# Patient Record
Sex: Male | Born: 1990 | Race: Black or African American | Hispanic: No | Marital: Single | Smoking: Former smoker
Health system: Southern US, Community
[De-identification: ages and names within clinical notes are randomized; demographics above are authoritative.]

## PROBLEM LIST (undated history)

## (undated) DIAGNOSIS — R51 Headache: Secondary | ICD-10-CM

## (undated) DIAGNOSIS — J45909 Unspecified asthma, uncomplicated: Secondary | ICD-10-CM

## (undated) DIAGNOSIS — B009 Herpesviral infection, unspecified: Secondary | ICD-10-CM

## (undated) DIAGNOSIS — M1990 Unspecified osteoarthritis, unspecified site: Secondary | ICD-10-CM

## (undated) DIAGNOSIS — F32A Depression, unspecified: Secondary | ICD-10-CM

## (undated) DIAGNOSIS — G4730 Sleep apnea, unspecified: Secondary | ICD-10-CM

## (undated) DIAGNOSIS — R519 Headache, unspecified: Secondary | ICD-10-CM

## (undated) HISTORY — DX: Headache, unspecified: R51.9

## (undated) HISTORY — DX: Depression, unspecified: F32.A

## (undated) HISTORY — DX: Unspecified asthma, uncomplicated: J45.909

## (undated) HISTORY — PX: BACK SURGERY: SHX140

## (undated) HISTORY — DX: Headache: R51

## (undated) HISTORY — DX: Sleep apnea, unspecified: G47.30

## (undated) HISTORY — DX: Unspecified osteoarthritis, unspecified site: M19.90

## (undated) HISTORY — DX: Herpesviral infection, unspecified: B00.9

## (undated) HISTORY — PX: SHOULDER SURGERY: SHX246

---

## 2011-12-04 ENCOUNTER — Other Ambulatory Visit: Payer: Self-pay

## 2011-12-04 ENCOUNTER — Emergency Department (HOSPITAL_COMMUNITY): Payer: BC Managed Care – PPO

## 2011-12-04 ENCOUNTER — Emergency Department (HOSPITAL_COMMUNITY)
Admission: EM | Admit: 2011-12-04 | Discharge: 2011-12-05 | Disposition: A | Discharge status: Home Health | Payer: BC Managed Care – PPO | Attending: Emergency Medicine | Admitting: Emergency Medicine

## 2011-12-04 ENCOUNTER — Encounter (HOSPITAL_COMMUNITY): Payer: Self-pay | Admitting: Emergency Medicine

## 2011-12-04 DIAGNOSIS — F17200 Nicotine dependence, unspecified, uncomplicated: Secondary | ICD-10-CM | POA: Insufficient documentation

## 2011-12-04 DIAGNOSIS — R0789 Other chest pain: Secondary | ICD-10-CM | POA: Insufficient documentation

## 2011-12-04 DIAGNOSIS — R51 Headache: Secondary | ICD-10-CM | POA: Insufficient documentation

## 2011-12-04 LAB — BASIC METABOLIC PANEL
Chloride: 101 mEq/L (ref 96–112)
Creatinine, Ser: 0.95 mg/dL (ref 0.50–1.35)
GFR calc Af Amer: >90 mL/min (ref >90)
Potassium: 3.6 mEq/L (ref 3.5–5.1)

## 2011-12-04 LAB — POCT I-STAT, CHEM 8
BUN: 10 mg/dL (ref 6–23)
Sodium: 143 mEq/L (ref 135–145)
TCO2: 28 mmol/L (ref 0–100)

## 2011-12-04 LAB — POCT I-STAT TROPONIN I: Troponin i, poc: 0 ng/mL (ref 0.00–0.08)

## 2011-12-04 LAB — CBC
Platelets: 277 10*3/uL (ref 150–400)
RDW: 12.1 % (ref 11.5–15.5)
WBC: 11.4 10*3/uL — ABNORMAL HIGH (ref 4.0–10.5)

## 2011-12-04 MED ORDER — SODIUM CHLORIDE 0.9 % IV BOLUS (SEPSIS)
1000.0000 mL | Freq: Once | INTRAVENOUS | Status: AC
  Administered 2011-12-04: 1000 mL via INTRAVENOUS

## 2011-12-04 MED ORDER — DIPHENHYDRAMINE HCL 50 MG/ML IJ SOLN
25.0000 mg | Freq: Once | INTRAMUSCULAR | Status: AC
  Administered 2011-12-04: 25 mg via INTRAVENOUS
  Filled 2011-12-04: qty 1

## 2011-12-04 MED ORDER — KETOROLAC TROMETHAMINE 30 MG/ML IJ SOLN
30.0000 mg | Freq: Once | INTRAMUSCULAR | Status: AC
  Administered 2011-12-04: 30 mg via INTRAVENOUS
  Filled 2011-12-04: qty 1

## 2011-12-04 MED ORDER — METOCLOPRAMIDE HCL 5 MG/ML IJ SOLN
10.0000 mg | Freq: Once | INTRAMUSCULAR | Status: AC
  Administered 2011-12-04: 10 mg via INTRAVENOUS
  Filled 2011-12-04: qty 2

## 2011-12-04 NOTE — ED Notes (Signed)
Pt here from with c/o chest pain after getting in a argument with his girlfriend

## 2011-12-04 NOTE — ED Provider Notes (Signed)
History     CSN: [id number]  Arrival date & time 12/04/11  1658   First MD Initiated Contact with Patient 12/04/11 2316      Chief Complaint  Patient presents with  . Chest Pain    (Consider location/radiation/quality/duration/timing/severity/associated sxs/prior treatment) Patient is a 21 y.o. male presenting with chest pain. The history is provided by the patient.  Chest Pain   He had onset yesterday of a sharp, left-sided chest pain without radiation. Pain is been episodic. When present, it was severe 9/10, but it is completely gone now. There is no associated dyspnea, nausea, or diaphoresis. While in the emergency department, he has developed a bifrontal headache which is throbbing in nature and worse with exposure to light. He has had a slight cough productive of some yellowish sputum. He has not had any fever or chills. He denies arthralgias or myalgias. He has not taken anything for the pain. When it was present, was worse with movement and was worse with deep breath. He has never had pain might this before.  History reviewed. No pertinent past medical history.  Past Surgical History  Procedure Date  . Back surgery     History reviewed. No pertinent family history.  History  Substance Use Topics  . Smoking status: Current Everyday Smoker -- 0.5 packs/day  . Smokeless tobacco: Not on file  . Alcohol Use: Yes      Review of Systems  Cardiovascular: Positive for chest pain.  All other systems reviewed and are negative.    Allergies  Review of patient's allergies indicates no known allergies.  Home Medications   Current Outpatient Rx  Name Route Sig Dispense Refill  . AMOXICILLIN 500 MG PO CAPS Oral Take 500 mg by mouth 3 (three) times daily. For ten days starting 11/22/11      BP 157/89  Pulse 64  Temp(Src) 98.6 F (37 C) (Oral)  Resp 16  Ht 7\' 4"  (2.235 m)  Wt 245 lb (111.131 kg)  BMI 22.24 kg/m2  SpO2 100%  Physical Exam  Nursing note and vitals  reviewed.  21 year old male is resting comfortably and in no acute distress. Vital signs are significant for tachypnea with respiratory rate of 30, and borderline hypertension with blood pressure 147/80. Oxygen saturation is 100% which is normal. Head is normocephalic and atraumatic. PERRLA, EOMI. Fundi show no hemorrhage, exudate, or papilledema. Oropharynx is clear. Neck is nontender and supple without adenopathy or JVD. Lungs are clear without rales, wheezes, or rhonchi. Heart has regular rate and rhythm without murmur. There is mild left parasternal chest wall tenderness inferiorly. Abdomen is soft, flat, nontender without masses or hepatosplenomegaly. Extremities no cyanosis or edema, full range of motion is present. Skin is warm and dry without rash. Neurologic: Mental status is normal, cranial nerves are intact, there no focal motor or sensory deficits.  ED Course  Procedures (including critical care time)  Results for orders placed during the hospital encounter of 12/04/11  CBC      Component Value Range   WBC 11.4 (*) 4.0 - 10.5 (K/uL)   RBC 5.28  4.22 - 5.81 (MIL/uL)   Hemoglobin 15.1  13.0 - 17.0 (g/dL)   HCT 69.6  29.5 - 28.4 (%)   MCV 86.4  78.0 - 100.0 (fL)   MCH 28.6  26.0 - 34.0 (pg)   MCHC 33.1  30.0 - 36.0 (g/dL)   RDW 13.2  44.0 - 10.2 (%)   Platelets 277  150 - 400 (K/uL)  BASIC METABOLIC PANEL      Component Value Range   Sodium 139  135 - 145 (mEq/L)   Potassium 3.6  3.5 - 5.1 (mEq/L)   Chloride 101  96 - 112 (mEq/L)   CO2 29  19 - 32 (mEq/L)   Glucose, Bld 100 (*) 70 - 99 (mg/dL)   BUN 10  6 - 23 (mg/dL)   Creatinine, Ser 0.98  0.50 - 1.35 (mg/dL)   Calcium 11.9  8.4 - 10.5 (mg/dL)   GFR calc non Af Amer >90  >90 (mL/min)   GFR calc Af Amer >90  >90 (mL/min)  POCT I-STAT, CHEM 8      Component Value Range   Sodium 143  135 - 145 (mEq/L)   Potassium 3.7  3.5 - 5.1 (mEq/L)   Chloride 103  96 - 112 (mEq/L)   BUN 10  6 - 23 (mg/dL)   Creatinine, Ser 1.47  0.50 -  1.35 (mg/dL)   Glucose, Bld 829 (*) 70 - 99 (mg/dL)   Calcium, Ion 5.62  1.30 - 1.32 (mmol/L)   TCO2 28  0 - 100 (mmol/L)   Hemoglobin 16.3  13.0 - 17.0 (g/dL)   HCT 86.5  78.4 - 69.6 (%)  POCT I-STAT TROPONIN I      Component Value Range   Troponin i, poc 0.00  0.00 - 0.08 (ng/mL)   Comment 3            Dg Chest 2 View  12/04/2011  *RADIOLOGY REPORT*  Clinical Data: 21 year old male with left chest pain, dizziness.  CHEST - 2 VIEW  Comparison: None.  Findings: Cardiac size at the upper limits of normal. Other mediastinal contours are within normal limits.  Visualized tracheal air column is within normal limits.  Lung volumes within normal limits.  The lungs are clear.  No pneumothorax or effusion. Negative visualized bowel gas pattern. No acute osseous abnormality identified.  IMPRESSION: Cardiac size at the upper limits of normal, otherwise negative chest.  Original Report Authenticated By: Harley Hallmark, M.D.      Date: 12/04/2011  Rate: 63  Rhythm: normal sinus rhythm  QRS Axis: normal  Intervals: normal  ST/T Wave abnormalities: normal  Conduction Disutrbances:none  Narrative Interpretation: Probable left ventricular hypertrophy, otherwise normal ECG. No old ECG available for comparison.  Old EKG Reviewed: none available  He was given IV hydration as well as IV Toradol and Reglan and Benadryl with good relief of his headache.  1. Chest pain   2. Headache       MDM  Chest pain which seems most likely be musculoskeletal. I see no evidence of cardiac or pulmonary pathology. Headache is probably muscle contraction headache. You have begin symptomatic treatment.        Dione Booze, MD 12/05/11 [phone]

## 2011-12-05 MED ORDER — METOCLOPRAMIDE HCL 10 MG PO TABS: 10.0000 mg | ORAL_TABLET | Freq: Four times a day (QID) | ORAL | Status: AC | PRN

## 2011-12-05 NOTE — Discharge Instructions (Signed)
Chest Pain (Nonspecific) It is often hard to give a specific diagnosis for the cause of chest pain. There is always a chance that your pain could be related to something serious, such as a heart attack or a blood clot in the lungs. You need to follow up with your caregiver for further evaluation. CAUSES   Heartburn.   Pneumonia or bronchitis.   Anxiety and stress.   Inflammation around your heart (pericarditis) or lung (pleuritis or pleurisy).   A blood clot in the lung.   A collapsed lung (pneumothorax). It can develop suddenly on its own (spontaneous pneumothorax) or from injury (trauma) to the chest.  The chest wall is composed of bones, muscles, and cartilage. Any of these can be the source of the pain.  The bones can be bruised by injury.   The muscles or cartilage can be strained by coughing or overwork.   The cartilage can be affected by inflammation and become sore (costochondritis).  DIAGNOSIS  Lab tests or other studies, such as X-rays, an EKG, stress testing, or cardiac imaging, may be needed to find the cause of your pain.  TREATMENT   Treatment depends on what may be causing your chest pain. Treatment may include:   Acid blockers for heartburn.   Anti-inflammatory medicine.   Pain medicine for inflammatory conditions.   Antibiotics if an infection is present.   You may be advised to change lifestyle habits. This includes stopping smoking and avoiding caffeine and chocolate.   You may be advised to keep your head raised (elevated) when sleeping. This reduces the chance of acid going backward from your stomach into your esophagus.   Most of the time, nonspecific chest pain will improve within 2 to 3 days with rest and mild pain medicine.  HOME CARE INSTRUCTIONS   If antibiotics were prescribed, take the full amount even if you start to feel better.   For the next few days, avoid physical activities that bring on chest pain. Continue physical activities as  directed.   Do not smoke cigarettes or drink alcohol until your symptoms are gone.   Only take over-the-counter or prescription medicine for pain, discomfort, or fever as directed by your caregiver.   Follow your caregiver's suggestions for further testing if your chest pain does not go away.   Keep any follow-up appointments you made. If you do not go to an appointment, you could develop lasting (chronic) problems with pain. If there is any problem keeping an appointment, you must call to reschedule.  SEEK MEDICAL CARE IF:   You think you are having problems from the medicine you are taking. Read your medicine instructions carefully.   Your chest pain does not go away, even after treatment.   You develop a rash with blisters on your chest.  SEEK IMMEDIATE MEDICAL CARE IF:   You have increased chest pain or pain that spreads to your arm, neck, jaw, back, or belly (abdomen).   You develop shortness of breath, an increasing cough, or you are coughing up blood.   You have severe back or abdominal pain, feel sick to your stomach (nauseous) or throw up (vomit).   You develop severe weakness, fainting, or chills.   You have an oral temperature above 102 F (38.9 C), not controlled by medicine.  THIS IS AN EMERGENCY. Do not wait to see if the pain will go away. Get medical help at once. Call your local emergency services (911 in [place] Do not drive yourself to   the hospital. MAKE SURE YOU:   Understand these instructions.   Will watch your condition.   Will get help right away if you are not doing well or get worse.  Document Released: 07/11/2005 Document Revised: 06/13/2011 Document Reviewed: 05/06/2008 [facility] Patient Information 2012 [place], [place].  Headache, General, Unknown Cause The specific cause of your headache may not have been found today. There are many causes and types of headache. A few common ones are:  Tension headache.   Migraine.   Infections (examples:  dental and sinus infections).   Bone and/or joint problems in the neck or jaw.   Depression.   Eye problems.  These headaches are not life threatening.  Headaches can sometimes be diagnosed by a patient history and a physical exam. Sometimes, lab and imaging studies (such as x-ray and/or CT scan) are used to rule out more serious problems. In some cases, a spinal tap (lumbar puncture) may be requested. There are many times when your exam and tests may be normal on the first visit even when there is a serious problem causing your headaches. Because of that, it is very important to follow up with your doctor or local clinic for further evaluation. FINDING OUT THE RESULTS OF TESTS  If a radiology test was performed, a radiologist will review your results.   You will be contacted by the emergency department or your physician if any test results require a change in your treatment plan.   Not all test results may be available during your visit. If your test results are not back during the visit, make an appointment with your caregiver to find out the results. Do not assume everything is normal if you have not heard from your caregiver or the medical facility. It is important for you to follow up on all of your test results.  HOME CARE INSTRUCTIONS   Keep follow-up appointments with your caregiver, or any specialist referral.   Only take over-the-counter or prescription medicines for pain, discomfort, or fever as directed by your caregiver.   Biofeedback, massage, or other relaxation techniques may be helpful.   Ice packs or heat applied to the head and neck can be used. Do this three to four times per day, or as needed.   Call your doctor if you have any questions or concerns.   If you smoke, you should quit.  SEEK MEDICAL CARE IF:   You develop problems with medications prescribed.   You do not respond to or obtain relief from medications.   You have a change from the usual headache.     You develop nausea or vomiting.  SEEK IMMEDIATE MEDICAL CARE IF:   If your headache becomes severe.   You have an unexplained oral temperature above 102 F (38.9 C), or as your caregiver suggests.   You have a stiff neck.   You have loss of vision.   You have muscular weakness.   You have loss of muscular control.   You develop severe symptoms different from your first symptoms.   You start losing your balance or have trouble walking.   You feel faint or pass out.  MAKE SURE YOU:   Understand these instructions.   Will watch your condition.   Will get help right away if you are not doing well or get worse.  Document Released: 10/01/2005 Document Revised: 06/13/2011 Document Reviewed: 05/20/2008 [facility] Patient Information 2012 [place], [place].  Metoclopramide tablets What is this medicine? METOCLOPRAMIDE (met oh kloe PRA mide) is  used to treat the symptoms of gastroesophageal reflux disease (GERD) like heartburn. It is also used to treat people with slow emptying of the stomach and intestinal tract. This medicine may be used for other purposes; ask your health care provider or pharmacist if you have questions. What should I tell my health care provider before I take this medicine? They need to know if you have any of these conditions: -breast cancer -depression -diabetes -heart failure -high blood pressure -kidney disease -liver disease -Parkinson's disease or a movement disorder -pheochromocytoma -seizures -stomach obstruction, bleeding, or perforation -an unusual or allergic reaction to metoclopramide, procainamide, sulfites, other medicines, foods, dyes, or preservatives -pregnant or trying to get pregnant -breast-feeding How should I use this medicine? Take this medicine by mouth with a glass of water. Follow the directions on the prescription label. Take this medicine on an empty stomach, about 30 minutes before eating. Take your doses at regular  intervals. Do not take your medicine more often than directed. Do not stop taking except on the advice of your doctor or health care professional. A special MedGuide will be given to you by the pharmacist with each prescription and refill. Be sure to read this information carefully each time. Talk to your pediatrician regarding the use of this medicine in children. Special care may be needed. Overdosage: If you think you have taken too much of this medicine contact a poison control center or emergency room at once. NOTE: This medicine is only for you. Do not share this medicine with others. What if I miss a dose? If you miss a dose, take it as soon as you can. If it is almost time for your next dose, take only that dose. Do not take double or extra doses. What may interact with this medicine? -acetaminophen -cyclosporine -digoxin -medicines for blood pressure -medicines for diabetes, including insulin -medicines for hay fever and other allergies -medicines for depression, especially an Monoamine Oxidase Inhibitor (MAOI) -medicines for Parkinson's disease, like levodopa -medicines for sleep or for pain -tetracycline This list may not describe all possible interactions. Give your health care provider a list of all the medicines, herbs, non-prescription drugs, or dietary supplements you use. Also tell them if you smoke, drink alcohol, or use illegal drugs. Some items may interact with your medicine. What should I watch for while using this medicine? It may take a few weeks for your stomach condition to start to get better. However, do not take this medicine for longer than 12 weeks. The longer you take this medicine, and the more you take it, the greater your chances are of developing serious side effects. If you are an elderly patient, a male patient, or you have diabetes, you may be at an increased risk for side effects from this medicine. Contact your doctor immediately if you start having  movements you cannot control such as lip smacking, rapid movements of the tongue, involuntary or uncontrollable movements of the eyes, head, arms and legs, or muscle twitches and spasms. Patients and their families should watch out for worsening depression or thoughts of suicide. Also watch out for any sudden or severe changes in feelings such as feeling anxious, agitated, panicky, irritable, hostile, aggressive, impulsive, severely restless, overly excited and hyperactive, or not being able to sleep. If this happens, especially at the beginning of treatment or after a change in dose, call your doctor. Do not treat yourself for high fever. Ask your doctor or health care professional for advice. You may get drowsy  or dizzy. Do not drive, use machinery, or do anything that needs mental alertness until you know how this drug affects you. Do not stand or sit up quickly, especially if you are an older patient. This reduces the risk of dizzy or fainting spells. Alcohol can make you more drowsy and dizzy. Avoid alcoholic drinks. What side effects may I notice from receiving this medicine? Side effects that you should report to your doctor or health care professional as soon as possible: -allergic reactions like skin rash, itching or hives, swelling of the face, lips, or tongue -abnormal production of milk in females -breast enlargement in both males and females -change in the way you walk -difficulty moving, speaking or swallowing -drooling, lip smacking, or rapid movements of the tongue -excessive sweating -fever -involuntary or uncontrollable movements of the eyes, head, arms and legs -irregular heartbeat or palpitations -muscle twitches and spasms -unusually weak or tired Side effects that usually do not require medical attention (report to your doctor or health care professional if they continue or are bothersome): -change in sex drive or performance -depressed mood -diarrhea -difficulty  sleeping -headache -menstrual changes -restless or nervous This list may not describe all possible side effects. Call your doctor for medical advice about side effects. You may report side effects to FDA at 1-800-FDA-1088. Where should I keep my medicine? Keep out of the reach of children. Store at room temperature between 20 and 25 degrees C (68 and 77 degrees F). Protect from light. Keep container tightly closed. Throw away any unused medicine after the expiration date. NOTE: This sheet is a summary. It may not cover all possible information. If you have questions about this medicine, talk to your doctor, pharmacist, or health care provider.  2012, [employer]. (05/26/2008 4:30:05 PM)

## 2011-12-05 NOTE — ED Notes (Signed)
EDP at bedside  

## 2011-12-05 NOTE — ED Notes (Signed)
Bolus completed

## 2011-12-05 NOTE — ED Notes (Signed)
Pt states that he has been having body aches, midsternal CP, and a migraine since early this AM. No N/V. No SOB or respiratory distress. No blurred vision. Will continue to monitor.

## 2012-02-03 ENCOUNTER — Emergency Department (HOSPITAL_COMMUNITY)
Admission: EM | Admit: 2012-02-03 | Discharge: 2012-02-03 | Disposition: A | Discharge status: Home / Self Care | Payer: BC Managed Care – PPO | Attending: Emergency Medicine | Admitting: Emergency Medicine

## 2012-02-03 ENCOUNTER — Emergency Department (HOSPITAL_COMMUNITY): Payer: BC Managed Care – PPO

## 2012-02-03 ENCOUNTER — Encounter (HOSPITAL_COMMUNITY): Payer: Self-pay

## 2012-02-03 DIAGNOSIS — W219XXA Striking against or struck by unspecified sports equipment, initial encounter: Secondary | ICD-10-CM | POA: Insufficient documentation

## 2012-02-03 DIAGNOSIS — F17200 Nicotine dependence, unspecified, uncomplicated: Secondary | ICD-10-CM | POA: Insufficient documentation

## 2012-02-03 DIAGNOSIS — M545 Low back pain, unspecified: Secondary | ICD-10-CM | POA: Insufficient documentation

## 2012-02-03 DIAGNOSIS — Y9367 Activity, basketball: Secondary | ICD-10-CM | POA: Insufficient documentation

## 2012-02-03 DIAGNOSIS — M79609 Pain in unspecified limb: Secondary | ICD-10-CM | POA: Insufficient documentation

## 2012-02-03 MED ORDER — CYCLOBENZAPRINE HCL 10 MG PO TABS: 10.0000 mg | ORAL_TABLET | Freq: Three times a day (TID) | ORAL | Status: AC | PRN

## 2012-02-03 MED ORDER — OXYCODONE-ACETAMINOPHEN 5-325 MG PO TABS
1.0000 | ORAL_TABLET | Freq: Once | ORAL | Status: AC
  Administered 2012-02-03: 1 via ORAL
  Filled 2012-02-03: qty 1

## 2012-02-03 MED ORDER — HYDROCODONE-ACETAMINOPHEN 5-325 MG PO TABS: 1.0000 | ORAL_TABLET | Freq: Four times a day (QID) | ORAL | Status: AC | PRN

## 2012-02-03 MED ORDER — IBUPROFEN 800 MG PO TABS: 800.0000 mg | ORAL_TABLET | Freq: Three times a day (TID) | ORAL | Status: AC | PRN

## 2012-02-03 MED ORDER — KETOROLAC TROMETHAMINE 60 MG/2ML IM SOLN
60.0000 mg | Freq: Once | INTRAMUSCULAR | Status: AC
  Administered 2012-02-03: 60 mg via INTRAMUSCULAR
  Filled 2012-02-03: qty 2

## 2012-02-03 NOTE — ED Provider Notes (Signed)
History     CSN: [id number]  Arrival date & time 02/03/12  4098   First MD Initiated Contact with Patient 02/03/12 1003      Chief Complaint  Patient presents with  . Back Pain   HPI The patient presents to the emergency department with back pain that started last night while playing basketball.  Patient, states he has had previous back surgery for herniated disc.  Patient states it lasts I will plan another player jumped and landed on his back causing pain and shooting pains into his lower extremities.  He states since that time.  He said pain has been constant in the mid to lower part of his back.  Patient states that he does not have any bladder/bowel incontinence, numbness, weakness, dizziness, chest pain, shortness of breath, nausea/vomiting, or diarrhea.  Patient states that he has not tried any treatments for his back pain.  Movement and certain positions make the pain worse.  History reviewed. No pertinent past medical history.  Past Surgical History  Procedure Date  . Back surgery     History reviewed. No pertinent family history.  History  Substance Use Topics  . Smoking status: Current Everyday Smoker -- 0.5 packs/day  . Smokeless tobacco: Not on file  . Alcohol Use: Yes     occ      Review of Systems All pertinent positives and negatives reviewed in the history of present illness  Allergies  Review of patient's allergies indicates no known allergies.  Home Medications  No current outpatient prescriptions on file.  BP 127/79  Pulse 55  Temp(Src) 97.7 F (36.5 C) (Oral)  Resp 16  SpO2 100%  Physical Exam Physical Examination: General appearance - alert, well appearing, and in no distress and oriented to person, place, and time Mental status - alert, oriented to person, place, and time, normal mood, behavior, speech, dress, motor activity, and thought processes Chest - clear to auscultation, no wheezes, rales or rhonchi, symmetric air entry, no tachypnea,  retractions or cyanosis Heart - normal rate, regular rhythm, normal S1, S2, no murmurs, rubs, clicks or gallops Back exam - pain with motion noted during exam, tenderness noted in the upper lumbar spine region laterally on both sides of the spine, negative straight-leg raise bilaterally, normal reflexes and strength bilateral lower extremities, sensory exam intact bilateral lower extremities Neurological - alert, oriented, normal speech, no focal findings or movement disorder noted, DTR's normal and symmetric, motor and sensory grossly normal bilaterally, normal muscle tone, no tremors, strength 5/5  ED Course  Procedures (including critical care time)  Labs Reviewed - No data to display Dg Lumbar Spine Complete  02/03/2012  *RADIOLOGY REPORT*  Clinical Data: Low back pain since playing basketball yesterday  LUMBAR SPINE - COMPLETE 4+ VIEW  Comparison: None.  Findings:  There are five non-rib bearing lumbar type vertebral bodies.  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  Normal alignment of the lumbar spine.  No anterolisthesis or retrolisthesis.  No pars defects.  Vertebral body heights and intervertebral disc spaces are preserved.  Limited visualization of bilateral SI joints is normal.  Regional bowel gas pattern is normal.  IMPRESSION: No explanation for patient's back pain.  Original Report Authenticated By: Waynard Reeds, M.D.    Patient has no neurological deficits are abnormal reflexes on exam. The patient will be treated for strain of his back and advised that this too could be a new disc issue, but with normal  neurological function.  He will be referred to his primary care doctor for further evaluation and whoever did his surgery on his back previously.    MDM   MDM Reviewed: nursing note and vitals Interpretation: x-ray           Carlyle Dolly, PA-C 02/04/12 [phone]

## 2012-02-03 NOTE — ED Notes (Signed)
Pt. reports having a lumbar decompression in the past.  Pt. reports that someone came down on his back while playing basketball.  Pt.reprots being in "severe pain and being unable to walk standings straight up.  Pt. reports not being able to feel his legs well since last night but now they ar e"just numb and tingling."  Pt. reports that his "legs are wobbling and shaking more when he walks now."

## 2012-02-03 NOTE — Discharge Instructions (Signed)
Return here as needed. Follow up with your back doctor. Ice and heat to your back. Your x-rays were normal.

## 2012-02-03 NOTE — ED Notes (Signed)
Pt with c/o middle back pain after playing basketball last night, hx of back surgery, pain 10/10

## 2012-02-05 NOTE — ED Provider Notes (Signed)
Medical screening examination/treatment/procedure(s) were performed by non-physician practitioner and as supervising physician I was immediately available for consultation/collaboration.  Geoffery Lyons, MD 02/05/12 [phone]

## 2013-04-24 IMAGING — CR DG CHEST 2V
2 series · 2 of 2 positions shown · non-contrast
Comparison: None.

CLINICAL DATA: 20-year-old male with left chest pain, dizziness.

CHEST - 2 VIEW

[w chest pa]
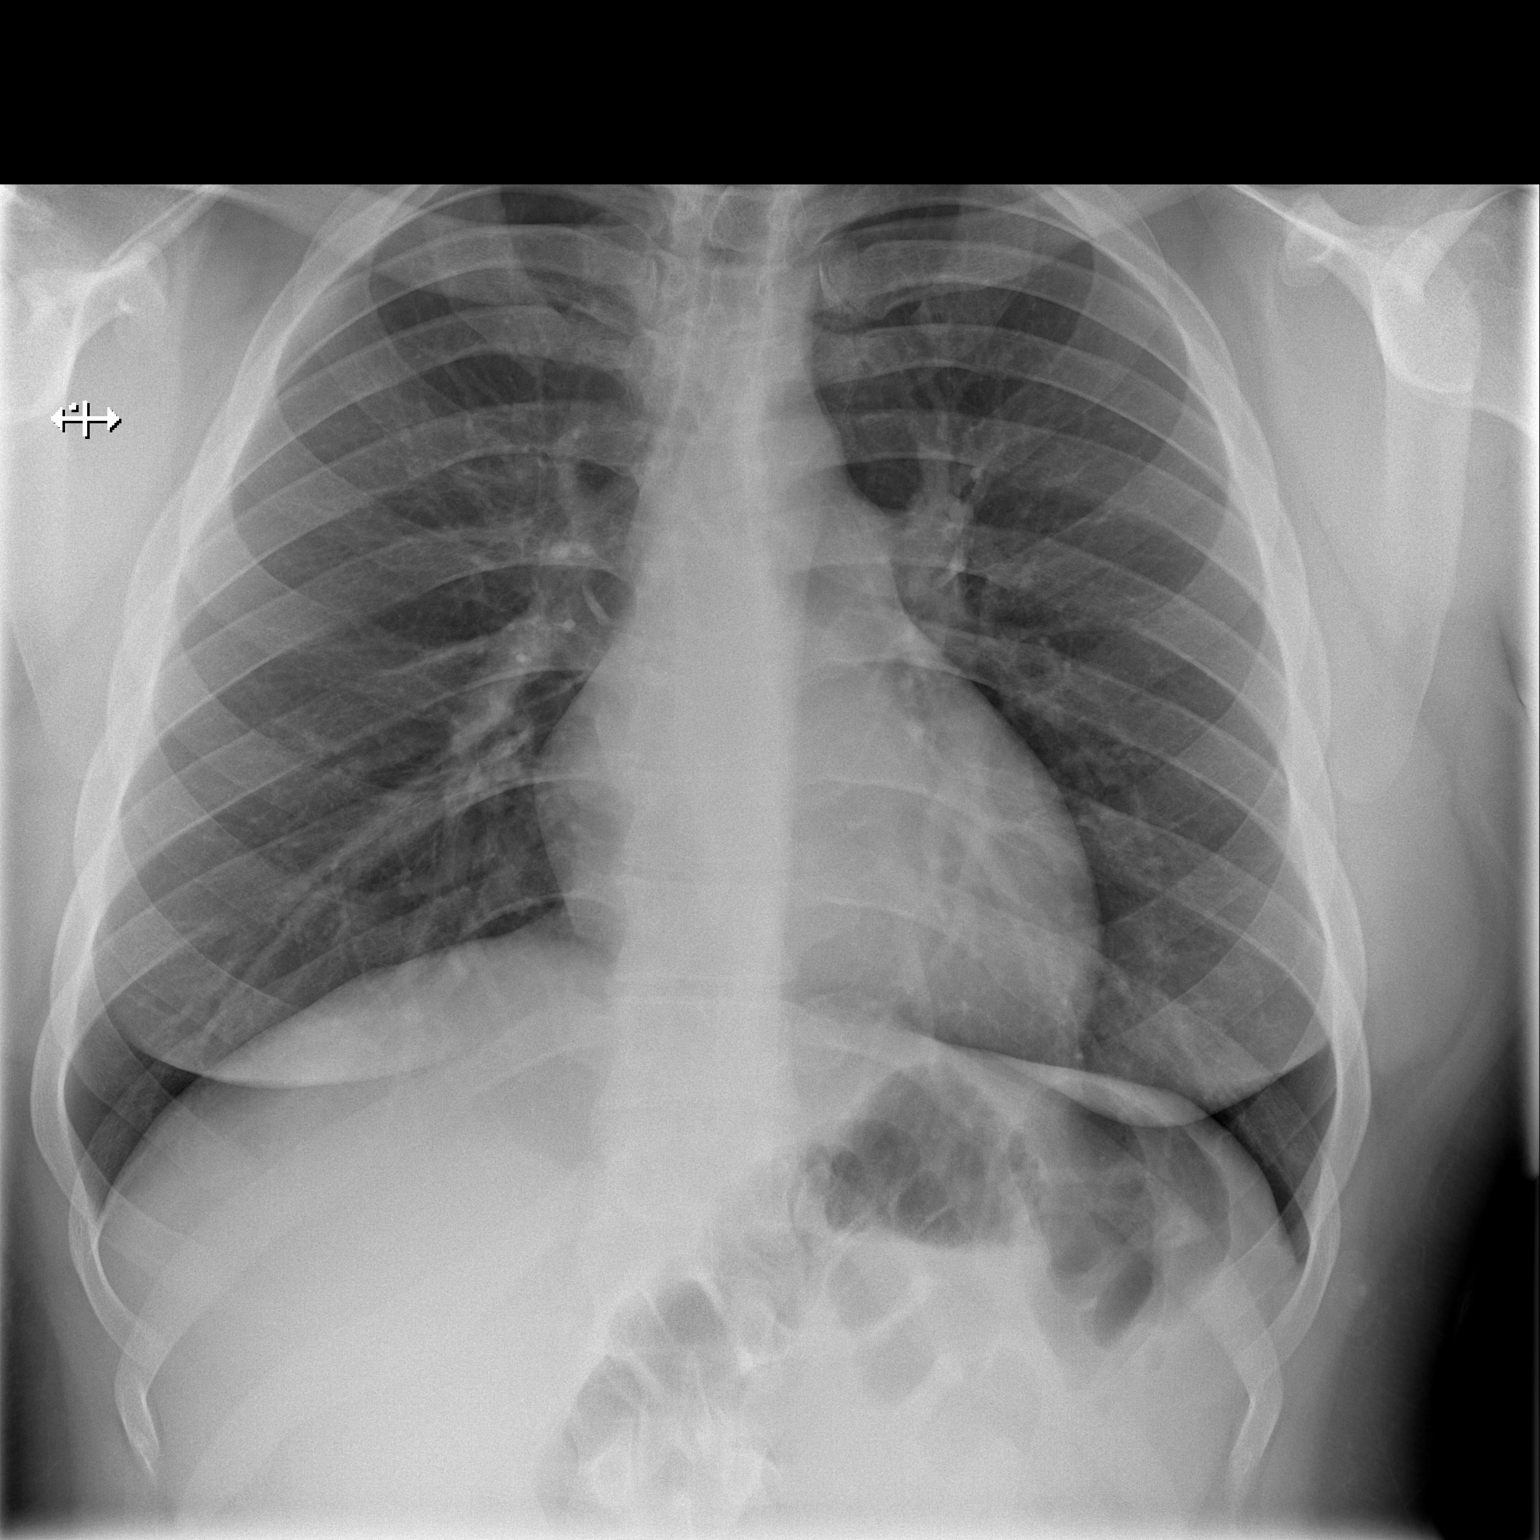

[w chest lat]
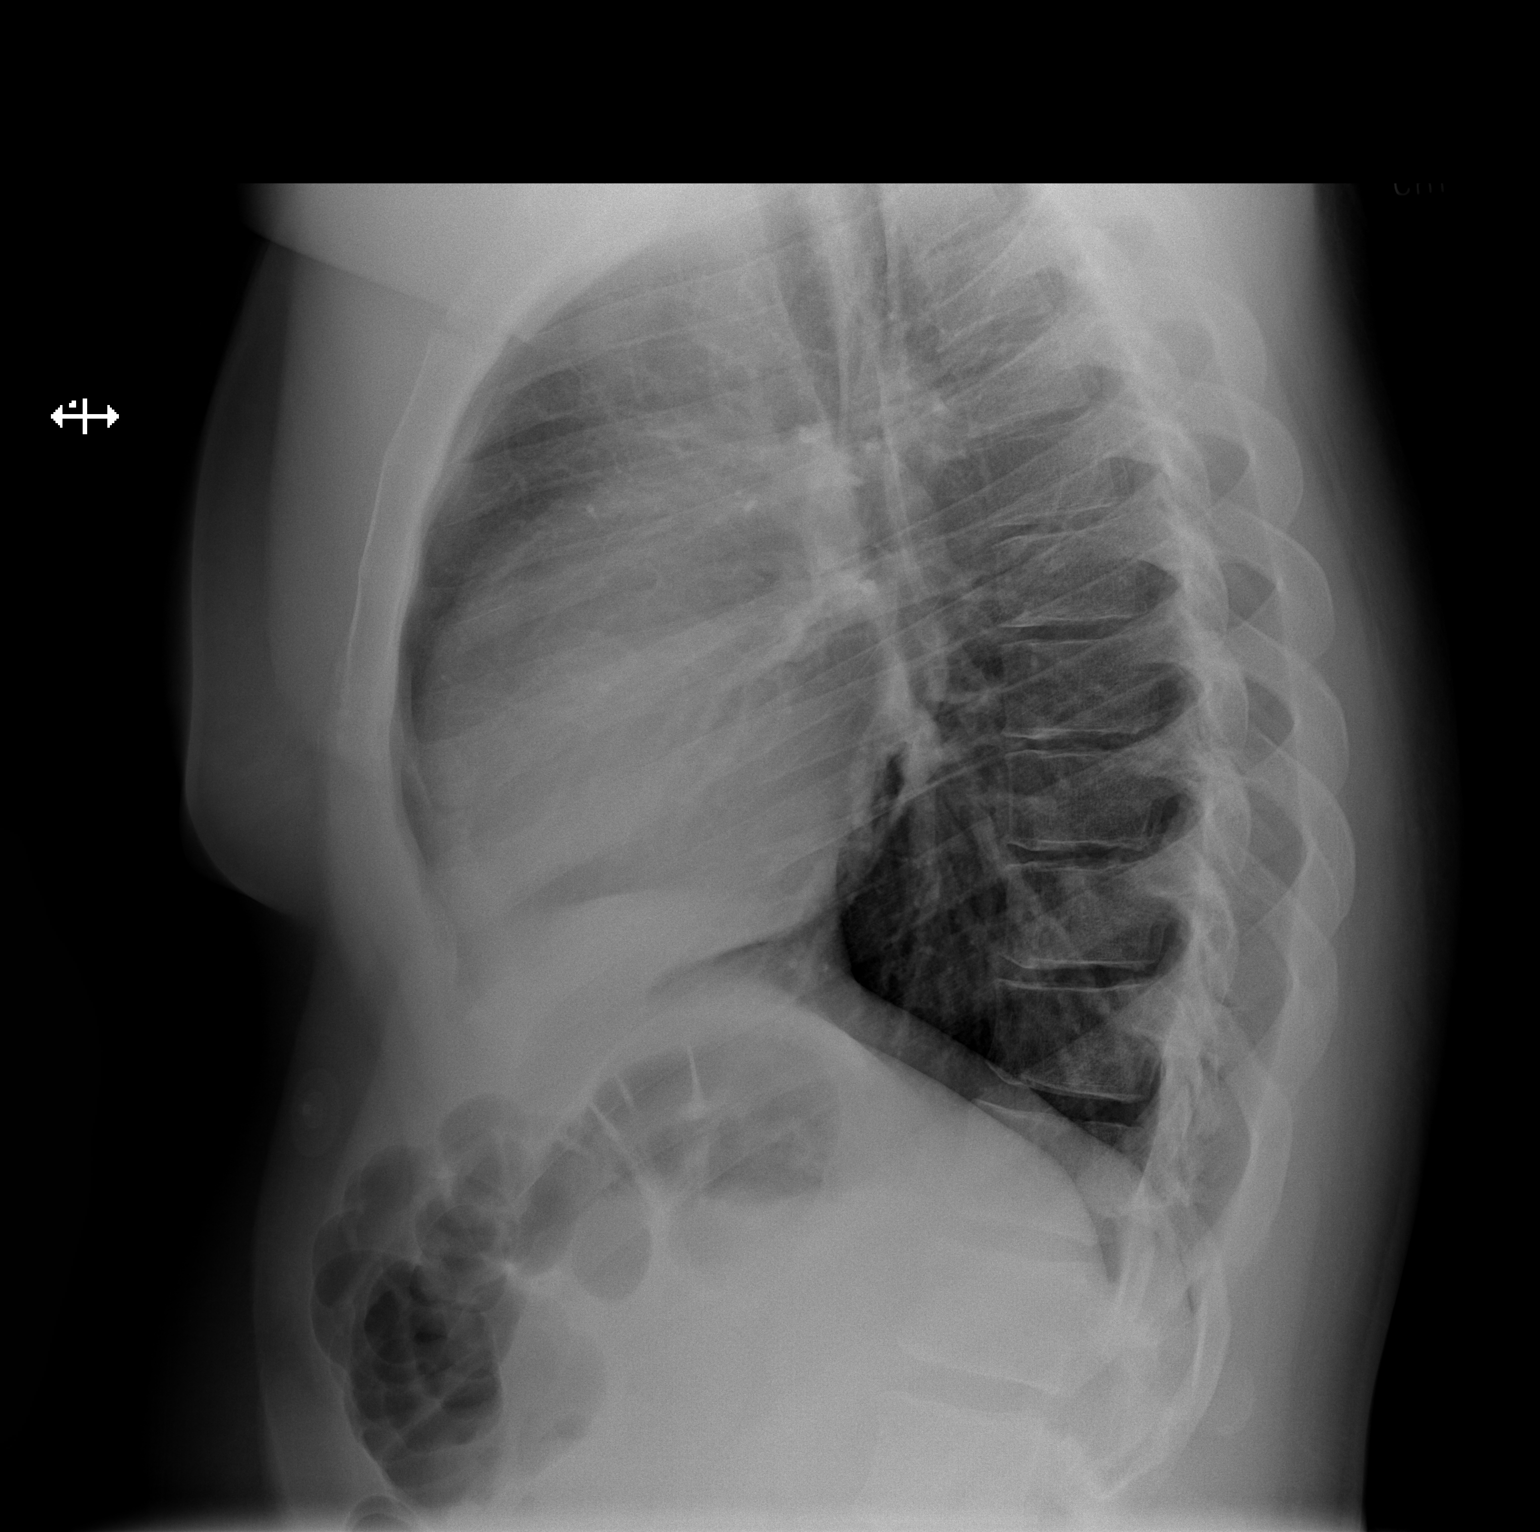

[2 of 2 positions shown; findings below may reference images not displayed]

FINDINGS: Cardiac size at the upper limits of normal. Other
mediastinal contours are within normal limits.  Visualized tracheal
air column is within normal limits.  Lung volumes within normal
limits.  The lungs are clear.  No pneumothorax or effusion.
Negative visualized bowel gas pattern. No acute osseous abnormality
identified.
IMPRESSION: Cardiac size at the upper limits of normal, otherwise negative
chest.

## 2018-05-20 ENCOUNTER — Encounter: Payer: Self-pay | Admitting: Family Medicine

## 2018-05-20 ENCOUNTER — Ambulatory Visit (INDEPENDENT_AMBULATORY_CARE_PROVIDER_SITE_OTHER): Payer: Managed Care, Other (non HMO) | Admitting: Family Medicine

## 2018-05-20 VITALS — BP 140/90 | HR 74 | Temp 98.6°F | Ht 76.0 in | Wt 270.0 lb

## 2018-05-20 DIAGNOSIS — R030 Elevated blood-pressure reading, without diagnosis of hypertension: Secondary | ICD-10-CM | POA: Diagnosis not present

## 2018-05-20 DIAGNOSIS — M545 Low back pain, unspecified: Secondary | ICD-10-CM | POA: Insufficient documentation

## 2018-05-20 DIAGNOSIS — G8929 Other chronic pain: Secondary | ICD-10-CM | POA: Insufficient documentation

## 2018-05-20 DIAGNOSIS — A6000 Herpesviral infection of urogenital system, unspecified: Secondary | ICD-10-CM

## 2018-05-20 DIAGNOSIS — Z0000 Encounter for general adult medical examination without abnormal findings: Secondary | ICD-10-CM

## 2018-05-20 DIAGNOSIS — Z0001 Encounter for general adult medical examination with abnormal findings: Secondary | ICD-10-CM | POA: Insufficient documentation

## 2018-05-20 MED ORDER — VALACYCLOVIR HCL 1 G PO TABS: 1000.0000 mg | ORAL_TABLET | Freq: Every day | ORAL | 4 refills | Status: DC

## 2018-05-20 NOTE — Patient Instructions (Signed)
Back Exercises The following exercises strengthen the muscles that help to support the back. They also help to keep the lower back flexible. Doing these exercises can help to prevent back pain or lessen existing pain. If you have back pain or discomfort, try doing these exercises 2-3 times each day or as told by your health care provider. When the pain goes away, do them once each day, but increase the number of times that you repeat the steps for each exercise (do more repetitions). If you do not have back pain or discomfort, do these exercises once each day or as told by your health care provider. Exercises Single Knee to Chest  Repeat these steps 3-5 times for each leg: 1. Lie on your back on a firm bed or the floor with your legs extended. 2. Bring one knee to your chest. Your other leg should stay extended and in contact with the floor. 3. Hold your knee in place by grabbing your knee or thigh. 4. Pull on your knee until you feel a gentle stretch in your lower back. 5. Hold the stretch for 10-30 seconds. 6. Slowly release and straighten your leg.  Pelvic Tilt  Repeat these steps 5-10 times: 1. Lie on your back on a firm bed or the floor with your legs extended. 2. Bend your knees so they are pointing toward the ceiling and your feet are flat on the floor. 3. Tighten your lower abdominal muscles to press your lower back against the floor. This motion will tilt your pelvis so your tailbone points up toward the ceiling instead of pointing to your feet or the floor. 4. With gentle tension and even breathing, hold this position for 5-10 seconds.  Cat-Cow  Repeat these steps until your lower back becomes more flexible: 1. Get into a hands-and-knees position on a firm surface. Keep your hands under your shoulders, and keep your knees under your hips. You may place padding under your knees for comfort. 2. Let your head hang down, and point your tailbone toward the floor so your lower back  becomes rounded like the back of a cat. 3. Hold this position for 5 seconds. 4. Slowly lift your head and point your tailbone up toward the ceiling so your back forms a sagging arch like the back of a cow. 5. Hold this position for 5 seconds.  Press-Ups  Repeat these steps 5-10 times: 1. Lie on your abdomen (face-down) on the floor. 2. Place your palms near your head, about shoulder-width apart. 3. While you keep your back as relaxed as possible and keep your hips on the floor, slowly straighten your arms to raise the top half of your body and lift your shoulders. Do not use your back muscles to raise your upper torso. You may adjust the placement of your hands to make yourself more comfortable. 4. Hold this position for 5 seconds while you keep your back relaxed. 5. Slowly return to lying flat on the floor.  Bridges  Repeat these steps 10 times: 1. Lie on your back on a firm surface. 2. Bend your knees so they are pointing toward the ceiling and your feet are flat on the floor. 3. Tighten your buttocks muscles and lift your buttocks off of the floor until your waist is at almost the same height as your knees. You should feel the muscles working in your buttocks and the back of your thighs. If you do not feel these muscles, slide your feet 1-2 inches farther away   from your buttocks. 4. Hold this position for 3-5 seconds. 5. Slowly lower your hips to the starting position, and allow your buttocks muscles to relax completely.  If this exercise is too easy, try doing it with your arms crossed over your chest. Abdominal Crunches  Repeat these steps 5-10 times: 1. Lie on your back on a firm bed or the floor with your legs extended. 2. Bend your knees so they are pointing toward the ceiling and your feet are flat on the floor. 3. Cross your arms over your chest. 4. Tip your chin slightly toward your chest without bending your neck. 5. Tighten your abdominal muscles and slowly raise your  trunk (torso) high enough to lift your shoulder blades a tiny bit off of the floor. Avoid raising your torso higher than that, because it can put too much stress on your low back and it does not help to strengthen your abdominal muscles. 6. Slowly return to your starting position.  Back Lifts Repeat these steps 5-10 times: 1. Lie on your abdomen (face-down) with your arms at your sides, and rest your forehead on the floor. 2. Tighten the muscles in your legs and your buttocks. 3. Slowly lift your chest off of the floor while you keep your hips pressed to the floor. Keep the back of your head in line with the curve in your back. Your eyes should be looking at the floor. 4. Hold this position for 3-5 seconds. 5. Slowly return to your starting position.  Contact a health care provider if:  Your back pain or discomfort gets much worse when you do an exercise.  Your back pain or discomfort does not lessen within 2 hours after you exercise. If you have any of these problems, stop doing these exercises right away. Do not do them again unless your health care provider says that you can. Get help right away if:  You develop sudden, severe back pain. If this happens, stop doing the exercises right away. Do not do them again unless your health care provider says that you can. This information is not intended to replace advice given to you by your health care provider. Make sure you discuss any questions you have with your health care provider. Document Released: 11/08/2004 Document Revised: 02/08/2016 Document Reviewed: 11/25/2014 Elsevier Interactive Patient Education  2017 [employer] BMI for Adults Body mass index (BMI) is a number that is calculated from a person's weight and height. In most adults, the number is used to find how much of an adult's weight is made up of fat. BMI is not as accurate as a direct measure of body fat. How is BMI calculated? BMI is calculated by dividing weight in  kilograms by height in meters squared. It can also be calculated by dividing weight in pounds by height in inches squared, then multiplying the resulting number by 703. Charts are available to help you find your BMI quickly and easily without doing this calculation. How is BMI interpreted? Health care professionals use BMI charts to identify whether an adult is underweight, at a normal weight, or overweight based on the following guidelines:  Underweight: BMI less than 18.5.  Normal weight: BMI between 18.5 and 24.9.  Overweight: BMI between 25 and 29.9.  Obese: BMI of 30 and above.  BMI is usually interpreted the same for males and females. Weight includes both fat and muscle, so someone with a muscular build, such as an athlete, may have a BMI that is higher than  24.9. In cases like these, BMI may not accurately depict body fat. To determine if excess body fat is the cause of a BMI of 25 or higher, further assessments may need to be done by a health care provider. Why is BMI a useful tool? BMI is used to identify a possible weight problem that may be related to a medical problem or may increase the risk for medical problems. BMI can also be used to promote changes to reach a healthy weight. This information is not intended to replace advice given to you by your health care provider. Make sure you discuss any questions you have with your health care provider. Document Released: 06/12/2004 Document Revised: 02/09/2016 Document Reviewed: 02/26/2014 Elsevier Interactive Patient Education  2018 [employer]  DASH Eating Plan DASH stands for "Dietary Approaches to Stop Hypertension." The DASH eating plan is a healthy eating plan that has been shown to reduce high blood pressure (hypertension). It may also reduce your risk for type 2 diabetes, heart disease, and stroke. The DASH eating plan may also help with weight loss. What are tips for following this plan? General guidelines  Avoid eating  more than 2,300 mg (milligrams) of salt (sodium) a day. If you have hypertension, you may need to reduce your sodium intake to 1,500 mg a day.  Limit alcohol intake to no more than 1 drink a day for nonpregnant women and 2 drinks a day for men. One drink equals 12 oz of beer, 5 oz of wine, or 1 oz of hard liquor.  Work with your health care provider to maintain a healthy body weight or to lose weight. Ask what an ideal weight is for you.  Get at least 30 minutes of exercise that causes your heart to beat faster (aerobic exercise) most days of the week. Activities may include walking, swimming, or biking.  Work with your health care provider or diet and nutrition specialist (dietitian) to adjust your eating plan to your individual calorie needs. Reading food labels  Check food labels for the amount of sodium per serving. Choose foods with less than 5 percent of the Daily Value of sodium. Generally, foods with less than 300 mg of sodium per serving fit into this eating plan.  To find whole grains, look for the word "whole" as the first word in the ingredient list. Shopping  Buy products labeled as "low-sodium" or "no salt added."  Buy fresh foods. Avoid canned foods and premade or frozen meals. Cooking  Avoid adding salt when cooking. Use salt-free seasonings or herbs instead of table salt or sea salt. Check with your health care provider or pharmacist before using salt substitutes.  Do not fry foods. Cook foods using healthy methods such as baking, boiling, grilling, and broiling instead.  Cook with heart-healthy oils, such as olive, canola, soybean, or sunflower oil. Meal planning   Eat a balanced diet that includes: ? 5 or more servings of fruits and vegetables each day. At each meal, try to fill half of your plate with fruits and vegetables. ? Up to 6-8 servings of whole grains each day. ? Less than 6 oz of lean meat, poultry, or fish each day. A 3-oz serving of meat is about the  same size as a deck of cards. One egg equals 1 oz. ? 2 servings of low-fat dairy each day. ? A serving of nuts, seeds, or beans 5 times each week. ? Heart-healthy fats. Healthy fats called Omega-3 fatty acids are found in foods such  as flaxseeds and coldwater fish, like sardines, salmon, and mackerel.  Limit how much you eat of the following: ? Canned or prepackaged foods. ? Food that is high in trans fat, such as fried foods. ? Food that is high in saturated fat, such as fatty meat. ? Sweets, desserts, sugary drinks, and other foods with added sugar. ? Full-fat dairy products.  Do not salt foods before eating.  Try to eat at least 2 vegetarian meals each week.  Eat more home-cooked food and less restaurant, buffet, and fast food.  When eating at a restaurant, ask that your food be prepared with less salt or no salt, if possible. What foods are recommended? The items listed may not be a complete list. Talk with your dietitian about what dietary choices are best for you. Grains Whole-grain or whole-wheat bread. Whole-grain or whole-wheat pasta. Brown rice. Orpah Cobb. Bulgur. Whole-grain and low-sodium cereals. Pita bread. Low-fat, low-sodium crackers. Whole-wheat flour tortillas. Vegetables Fresh or frozen vegetables (raw, steamed, roasted, or grilled). Low-sodium or reduced-sodium tomato and vegetable juice. Low-sodium or reduced-sodium tomato sauce and tomato paste. Low-sodium or reduced-sodium canned vegetables. Fruits All fresh, dried, or frozen fruit. Canned fruit in natural juice (without added sugar). Meat and other protein foods Skinless chicken or [place]. Ground chicken or [place]. Pork with fat trimmed off. Fish and seafood. Egg whites. Dried beans, peas, or lentils. Unsalted nuts, nut butters, and seeds. Unsalted canned beans. Lean cuts of beef with fat trimmed off. Low-sodium, lean deli meat. Dairy Low-fat (1%) or fat-free (skim) milk. Fat-free, low-fat, or reduced-fat  cheeses. Nonfat, low-sodium ricotta or cottage cheese. Low-fat or nonfat yogurt. Low-fat, low-sodium cheese. Fats and oils Soft margarine without trans fats. Vegetable oil. Low-fat, reduced-fat, or light mayonnaise and salad dressings (reduced-sodium). Canola, safflower, olive, soybean, and sunflower oils. Avocado. Seasoning and other foods Herbs. Spices. Seasoning mixes without salt. Unsalted popcorn and pretzels. Fat-free sweets. What foods are not recommended? The items listed may not be a complete list. Talk with your dietitian about what dietary choices are best for you. Grains Baked goods made with fat, such as croissants, muffins, or some breads. Dry pasta or rice meal packs. Vegetables Creamed or fried vegetables. Vegetables in a cheese sauce. Regular canned vegetables (not low-sodium or reduced-sodium). Regular canned tomato sauce and paste (not low-sodium or reduced-sodium). Regular tomato and vegetable juice (not low-sodium or reduced-sodium). Rosita Fire. Olives. Fruits Canned fruit in a light or heavy syrup. Fried fruit. Fruit in cream or butter sauce. Meat and other protein foods Fatty cuts of meat. Ribs. Fried meat. Tomasa Blase. Sausage. Bologna and other processed lunch meats. Salami. Fatback. Hotdogs. Bratwurst. Salted nuts and seeds. Canned beans with added salt. Canned or smoked fish. Whole eggs or egg yolks. Chicken or [place] with skin. Dairy Whole or 2% milk, cream, and half-and-half. Whole or full-fat cream cheese. Whole-fat or sweetened yogurt. Full-fat cheese. Nondairy creamers. Whipped toppings. Processed cheese and cheese spreads. Fats and oils Butter. Stick margarine. Lard. Shortening. Ghee. Bacon fat. Tropical oils, such as coconut, palm kernel, or palm oil. Seasoning and other foods Salted popcorn and pretzels. Onion salt, garlic salt, seasoned salt, table salt, and sea salt. Worcestershire sauce. Tartar sauce. Barbecue sauce. Teriyaki sauce. Soy sauce, including  reduced-sodium. Steak sauce. Canned and packaged gravies. Fish sauce. Oyster sauce. Cocktail sauce. Horseradish that you find on the shelf. Ketchup. Mustard. Meat flavorings and tenderizers. Bouillon cubes. Hot sauce and Tabasco sauce. Premade or packaged marinades. Premade or packaged taco seasonings. Relishes. Regular salad dressings.  Where to find more information:  National Heart, Lung, and Blood Institute: PopSteam.is  American Heart Association: www.heart.org Summary  The DASH eating plan is a healthy eating plan that has been shown to reduce high blood pressure (hypertension). It may also reduce your risk for type 2 diabetes, heart disease, and stroke.  With the DASH eating plan, you should limit salt (sodium) intake to 2,300 mg a day. If you have hypertension, you may need to reduce your sodium intake to 1,500 mg a day.  When on the DASH eating plan, aim to eat more fresh fruits and vegetables, whole grains, lean proteins, low-fat dairy, and heart-healthy fats.  Work with your health care provider or diet and nutrition specialist (dietitian) to adjust your eating plan to your individual calorie needs. This information is not intended to replace advice given to you by your health care provider. Make sure you discuss any questions you have with your health care provider. Document Released: 09/20/2011 Document Revised: 09/24/2016 Document Reviewed: 09/24/2016 Elsevier Interactive Patient Education  2018 [employer].  Health Maintenance, Male A healthy lifestyle and preventive care is important for your health and wellness. Ask your health care provider about what schedule of regular examinations is right for you. What should I know about weight and diet? Eat a Healthy Diet  Eat plenty of vegetables, fruits, whole grains, low-fat dairy products, and lean protein.  Do not eat a lot of foods high in solid fats, added sugars, or salt.  Maintain a Healthy Weight Regular exercise  can help you achieve or maintain a healthy weight. You should:  Do at least 150 minutes of exercise each week. The exercise should increase your heart rate and make you sweat (moderate-intensity exercise).  Do strength-training exercises at least twice a week.  Watch Your Levels of Cholesterol and Blood Lipids  Have your blood tested for lipids and cholesterol every 5 years starting at 27 years of age. If you are at high risk for heart disease, you should start having your blood tested when you are 27 years old. You may need to have your cholesterol levels checked more often if: ? Your lipid or cholesterol levels are high. ? You are older than 27 years of age. ? You are at high risk for heart disease.  What should I know about cancer screening? Many types of cancers can be detected early and may often be prevented. Lung Cancer  You should be screened every year for lung cancer if: ? You are a current smoker who has smoked for at least 30 years. ? You are a former smoker who has quit within the past 15 years.  Talk to your health care provider about your screening options, when you should start screening, and how often you should be screened.  Colorectal Cancer  Routine colorectal cancer screening usually begins at 27 years of age and should be repeated every 5-10 years until you are 27 years old. You may need to be screened more often if early forms of precancerous polyps or small growths are found. Your health care provider may recommend screening at an earlier age if you have risk factors for colon cancer.  Your health care provider may recommend using home test kits to check for hidden blood in the stool.  A small camera at the end of a tube can be used to examine your colon (sigmoidoscopy or colonoscopy). This checks for the earliest forms of colorectal cancer.  Prostate and Testicular Cancer  Depending on your  age and overall health, your health care provider may do certain  tests to screen for prostate and testicular cancer.  Talk to your health care provider about any symptoms or concerns you have about testicular or prostate cancer.  Skin Cancer  Check your skin from head to toe regularly.  Tell your health care provider about any new moles or changes in moles, especially if: ? There is a change in a mole's size, shape, or color. ? You have a mole that is larger than a pencil eraser.  Always use sunscreen. Apply sunscreen liberally and repeat throughout the day.  Protect yourself by wearing long sleeves, pants, a wide-brimmed hat, and sunglasses when outside.  What should I know about heart disease, diabetes, and high blood pressure?  If you are 74-56 years of age, have your blood pressure checked every 3-5 years. If you are 42 years of age or older, have your blood pressure checked every year. You should have your blood pressure measured twice-once when you are at a hospital or clinic, and once when you are not at a hospital or clinic. Record the average of the two measurements. To check your blood pressure when you are not at a hospital or clinic, you can use: ? An automated blood pressure machine at a pharmacy. ? A home blood pressure monitor.  Talk to your health care provider about your target blood pressure.  If you are between 73-54 years old, ask your health care provider if you should take aspirin to prevent heart disease.  Have regular diabetes screenings by checking your fasting blood sugar level. ? If you are at a normal weight and have a low risk for diabetes, have this test once every three years after the age of 37. ? If you are overweight and have a high risk for diabetes, consider being tested at a younger age or more often.  A one-time screening for abdominal aortic aneurysm (AAA) by ultrasound is recommended for men aged 65-75 years who are current or former smokers. What should I know about preventing infection? Hepatitis B If you  have a higher risk for hepatitis B, you should be screened for this virus. Talk with your health care provider to find out if you are at risk for hepatitis B infection. Hepatitis C Blood testing is recommended for:  Everyone born from [birth year] through 1965.  Anyone with known risk factors for hepatitis C.  Sexually Transmitted Diseases (STDs)  You should be screened each year for STDs including gonorrhea and chlamydia if: ? You are sexually active and are younger than 27 years of age. ? You are older than 27 years of age and your health care provider tells you that you are at risk for this type of infection. ? Your sexual activity has changed since you were last screened and you are at an increased risk for chlamydia or gonorrhea. Ask your health care provider if you are at risk.  Talk with your health care provider about whether you are at high risk of being infected with HIV. Your health care provider may recommend a prescription medicine to help prevent HIV infection.  What else can I do?  Schedule regular health, dental, and eye exams.  Stay current with your vaccines (immunizations).  Do not use any tobacco products, such as cigarettes, chewing tobacco, and e-cigarettes. If you need help quitting, ask your health care provider.  Limit alcohol intake to no more than 2 drinks per day. One drink equals 12 ounces  of beer, 5 ounces of wine, or 1 ounces of hard liquor.  Do not use street drugs.  Do not share needles.  Ask your health care provider for help if you need support or information about quitting drugs.  Tell your health care provider if you often feel depressed.  Tell your health care provider if you have ever been abused or do not feel safe at home. This information is not intended to replace advice given to you by your health care provider. Make sure you discuss any questions you have with your health care provider. Document Released: 03/29/2008 Document Revised:  05/30/2016 Document Reviewed: 07/05/2015 Elsevier Interactive Patient Education  2018 [employer].  Genital Herpes Genital herpes is a common sexually transmitted infection (STI) that is caused by a virus. The virus spreads from person to person through sexual contact. Infection can cause itching, blisters, and sores around the genitals or rectum. Symptoms may last several days and then go away This is called an outbreak. However, the virus remains in your body, so you may have more outbreaks in the future. The time between outbreaks varies and can be months or years. Genital herpes affects men and women. It is particularly concerning for pregnant women because the virus can be passed to the baby during delivery and can cause serious problems. Genital herpes is also a concern for people who have a weak disease-fighting (immune) system. What are the causes? This condition is caused by the herpes simplex virus (HSV) type 1 or type 2. The virus may spread through:  Sexual contact with an infected person, including vaginal, anal, and oral sex.  Contact with fluid from a herpes sore.  The skin. This means that you can get herpes from an infected partner even if he or she does not have a visible sore or does not know that he or she is infected.  What increases the risk? You are more likely to develop this condition if:  You have sex with many partners.  You do not use latex condoms during sex.  What are the signs or symptoms? Most people do not have symptoms (asymptomatic) or have mild symptoms that may be mistaken for other skin problems. Symptoms may include:  Small red bumps near the genitals, rectum, or mouth. These bumps turn into blisters and then turn into sores.  Flu-like symptoms, including: ? Fever. ? Body aches. ? Swollen lymph nodes. ? Headache.  Painful urination.  Pain and itching in the genital area or rectal area.  Vaginal discharge.  Tingling or shooting pain in  the legs and buttocks.  Generally, symptoms are more severe and last longer during the first (primary) outbreak. Flu-like symptoms are also more common during the primary outbreak. How is this diagnosed? Genital herpes may be diagnosed based on:  A physical exam.  Your medical history.  Blood tests.  A test of a fluid sample (culture) from an open sore.  How is this treated? There is no cure for this condition, but treatment with antiviral medicines that are taken by mouth (orally) can do the following:  Speed up healing and relieve symptoms.  Help to reduce the spread of the virus to sexual partners.  Limit the chance of future outbreaks, or make future outbreaks shorter.  Lessen symptoms of future outbreaks.  Your health care provider may also recommend pain relief medicines, such as aspirin or ibuprofen. Follow these instructions at home: Sexual activity  Do not have sexual contact during active outbreaks.  Practice  safe sex. Latex condoms and male condoms may help prevent the spread of the herpes virus. General instructions  Keep the affected areas dry and clean.  Take over-the-counter and prescription medicines only as told by your health care provider.  Avoid rubbing or touching blisters and sores. If you do touch blisters or sores: ? Wash your hands thoroughly with soap and water. ? Do not touch your eyes afterward.  To help relieve pain or itching, you may take the following actions as directed by your health care provider: ? Apply a cold, wet cloth (cold compress) to affected areas 4-6 times a day. ? Apply a substance that protects your skin and reduces bleeding (astringent). ? Apply a gel that helps relieve pain around sores (lidocaine gel). ? Take a warm, shallow bath that cleans the genital area (sitz bath).  Keep all follow-up visits as told by your health care provider. This is important. How is this prevented?  Use condoms. Although anyone can get  genital herpes during sexual contact, even with the use of a condom, a condom can provide some protection.  Avoid having multiple sexual partners.  Talk with your sexual partner about any symptoms either of you may have. Also, talk with your partner about any history of STIs.  Get tested for STIs before you have sex. Ask your partner to do the same.  Do not have sexual contact if you have symptoms of genital herpes. Contact a health care provider if:  Your symptoms are not improving with medicine.  Your symptoms return.  You have new symptoms.  You have a fever.  You have abdominal pain.  You have redness, swelling, or pain in your eye.  You notice new sores on other parts of your body.  You are a woman and experience bleeding between menstrual periods.  You have had herpes and you become pregnant or plan to become pregnant. Summary  Genital herpes is a common sexually transmitted infection (STI) that is caused by the herpes simplex virus (HSV) type 1 or type 2.  These viruses are most often spread through sexual contact with an infected person.  You are more likely to develop this condition if you have sex with many partners or you have unprotected sex.  Most people do not have symptoms (asymptomatic) or have mild symptoms that may be mistaken for other skin problems. Symptoms occur as outbreaks that may happen months or years apart.  There is no cure for this condition, but treatment with oral antiviral medicines can reduce symptoms, reduce the chance of spreading the virus to a partner, prevent future outbreaks, or shorten future outbreaks. This information is not intended to replace advice given to you by your health care provider. Make sure you discuss any questions you have with your health care provider. Document Released: 09/28/2000 Document Revised: 08/31/2016 Document Reviewed: 08/31/2016 Elsevier Interactive Patient Education  [employer].

## 2018-05-20 NOTE — Progress Notes (Signed)
Subjective:  Patient ID: Gary Irwin, male    DOB: 06-10-1991  Age: 27 y.o. MRN: [id number]  CC: Establish Care (pt wants to establish care, pt would like to discuss a physical)   HPI Gary Irwin presents for for a physical exam and follow-up of medical issues.  Recently his blood pressure is been elevated here and had a recent dental visit.  He admits to weight gain leaving the [employer].  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  He has a past medical history of chronic lower back pain.  He is status post microdisc ectomy.  He is currently under chiropractic care for this issue.  He is on Valtrex suppression therapy for genital herpes.  He lives with his fianc and Gary Irwin children.  He has a 58-year-old daughter from a previous marriage and she spends time with him and her mother.  He does smoke a half a pack of cigarettes a day.  He rarely consumes alcohol.  He has a strong family history of alcohol.  He has an uncle who passed from prostate cancer at age 36.  History Gary Irwin has a past medical history of Herpes simplex type 2 infection.   He has a past surgical history that includes Back surgery.   His family history includes Hypertension in his father and mother.He reports that he has been smoking cigarettes.  He has been smoking about 0.50 packs per day. He has never used smokeless tobacco. He reports that he drinks alcohol. He reports that he does not use drugs.  Outpatient Medications Prior to Visit  Medication Sig Dispense Refill  . valACYclovir (VALTREX) 1000 MG tablet Take 1,000 mg by mouth daily.    . valACYclovir (VALTREX) 1000 MG tablet Take by mouth.     No facility-administered medications prior to visit.     ROS Review of Systems  Constitutional: Negative.   HENT: Negative.   Eyes: Negative.   Respiratory: Negative.   Cardiovascular: Negative.   Gastrointestinal: Negative.   Endocrine: Negative for polyphagia and polyuria.  Genitourinary: Negative.     Musculoskeletal: Positive for back pain. Negative for gait problem and myalgias.  Skin: Negative.   Allergic/Immunologic: Negative for immunocompromised state.  Neurological: Negative.   Hematological: Does not bruise/bleed easily.  Psychiatric/Behavioral: Negative.     Objective:  BP 140/90   Pulse 74   Temp 98.6 F (37 C) (Oral)   Ht '6\' 4"'$  (1.93 m)   Wt 270 lb (122.5 kg)   SpO2 98%   BMI 32.87 kg/m   Physical Exam  Constitutional: He is oriented to person, place, and time. He appears well-developed and well-nourished. No distress.  HENT:  Head: Normocephalic and atraumatic.  Right Ear: External ear normal.  Left Ear: External ear normal.  Mouth/Throat: Oropharynx is clear and moist. No oropharyngeal exudate.  Eyes: Pupils are equal, round, and reactive to light. Conjunctivae and EOM are normal. Right eye exhibits no discharge. Left eye exhibits no discharge. No scleral icterus.  Neck: Normal range of motion. Neck supple. No JVD present. No tracheal deviation present. No thyromegaly present.  Cardiovascular: Normal rate, regular rhythm and normal heart sounds.  Pulmonary/Chest: Effort normal and breath sounds normal.  Abdominal: Soft. Bowel sounds are normal. He exhibits no distension and no mass. There is no tenderness. There is no guarding. Hernia confirmed negative in the right inguinal area and confirmed negative in the left inguinal area.  Genitourinary: Testes normal and penis normal. Right testis shows no  mass, no swelling and no tenderness. Right testis is descended. Left testis shows no mass, no swelling and no tenderness. Left testis is descended. No phimosis, paraphimosis, hypospadias, penile erythema or penile tenderness. No discharge found.  Musculoskeletal: Normal range of motion. He exhibits no edema, tenderness or deformity.  Lymphadenopathy:    He has no cervical adenopathy. No inguinal adenopathy noted on the right or left side.  Neurological: He is alert and  oriented to person, place, and time.  Skin: Skin is warm and dry. He is not diaphoretic.  Psychiatric: He has a normal mood and affect. His behavior is normal.      Assessment & Plan:   Porter was seen today for establish care.  Diagnoses and all orders for this visit:  Encounter for health maintenance examination with abnormal findings -     CBC; Future -     Comprehensive metabolic panel; Future -     HIV antibody; Future -     Lipid panel; Future -     TSH; Future -     Urinalysis, Routine w reflex microscopic; Future  Elevated BP without diagnosis of hypertension -     CBC; Future -     Comprehensive metabolic panel; Future -     TSH; Future -     Urinalysis, Routine w reflex microscopic; Future  Morbid obesity ([place]) -     Amb ref to Medical Nutrition Therapy-MNT -     TSH; Future  Chronic midline low back pain, with sciatica presence unspecified  Genital herpes simplex, unspecified site -     valACYclovir (VALTREX) 1000 MG tablet; Take 1 tablet (1,000 mg total) by mouth daily.   I have discontinued Jamieon L. Geiger's valACYclovir. I have also changed his valACYclovir.  Meds ordered this encounter  Medications  . valACYclovir (VALTREX) 1000 MG tablet    Sig: Take 1 tablet (1,000 mg total) by mouth daily.    Dispense:  100 tablet    Refill:  4   Patient will follow-up fasting for ordered blood work.  He has been referred for nutrition and counseling.  He was given information on the DASH diet to help him lower his blood pressure he is aware that weight loss accompanied by lifestyle changes would impact his blood pressure in a positive way and possibly avoid the use of medical therapy.  He will purchase a blood pressure cuff and check his blood pressure randomly over the next 3 months.  He was given anticipatory guidance on health maintenance and prevention of disease.  We will continue Valtrex suppression of genital herpes.  He will continue seeing the chiropractor  for his back pain.  Follow-up: Return in about 3 months (around 08/20/2018).  Libby Maw, MD

## 2018-05-27 ENCOUNTER — Ambulatory Visit: Payer: Managed Care, Other (non HMO) | Admitting: Registered"

## 2021-06-17 ENCOUNTER — Ambulatory Visit
Admission: EM | Admit: 2021-06-17 | Discharge: 2021-06-17 | Disposition: A | Discharge status: Home / Self Care | Attending: Internal Medicine | Admitting: Internal Medicine

## 2021-06-17 ENCOUNTER — Encounter: Payer: Self-pay | Admitting: Emergency Medicine

## 2021-06-17 ENCOUNTER — Other Ambulatory Visit: Payer: Self-pay

## 2021-06-17 DIAGNOSIS — L0231 Cutaneous abscess of buttock: Secondary | ICD-10-CM

## 2021-06-17 MED ORDER — DOXYCYCLINE HYCLATE 100 MG PO CAPS: 100.0000 mg | ORAL_CAPSULE | Freq: Two times a day (BID) | ORAL | 0 refills | Status: DC

## 2021-06-17 NOTE — ED Provider Notes (Signed)
[facility]    CSN: [id number] Arrival date & time: 06/17/21  0801      History   Chief Complaint Chief Complaint  Patient presents with   Abscess    HPI Gary PANEK is a 30 y.o. male who presents with an abscess on his buttocks x 2 days. Has not been draining. Had an abscess  several years ago, but does not know if it was staph or not since it was not cultured.     Past Medical History:  Diagnosis Date   Frequent headaches    Herpes simplex type 2 infection     Patient Active Problem List   Diagnosis Date Noted   Encounter for health maintenance examination with abnormal findings 05/20/2018   Elevated BP without diagnosis of hypertension 05/20/2018   Morbid obesity (HCC) 05/20/2018   Chronic midline low back pain 05/20/2018   Genital herpes simplex 05/20/2018    Past Surgical History:  Procedure Laterality Date   BACK SURGERY     SHOULDER SURGERY Left        Home Medications    Prior to Admission medications   Medication Sig Start Date End Date Taking? Authorizing Provider  doxycycline (VIBRAMYCIN) 100 MG capsule Take 1 capsule (100 mg total) by mouth 2 (two) times daily. 06/17/21  Yes Rodriguez-Southworth, Nettie Elm, PA-C  valACYclovir (VALTREX) 1000 MG tablet Take 1 tablet (1,000 mg total) by mouth daily. 05/20/18  Yes Mliss Sax, MD    Family History Family History  Problem Relation Age of Onset   Hypertension Mother    Hypertension Father     Social History Social History   Tobacco Use   Smoking status: Every Day    Types: Cigars   Smokeless tobacco: Never  Vaping Use   Vaping Use: Never used  Substance Use Topics   Alcohol use: Yes    Comment: occ   Drug use: No     Allergies   Latex and Shellfish-derived products   Review of Systems Review of Systems  Skin:        Swelling and pain on L inner buttocks which is causing pain when he tried to void and have a BM    Physical Exam Triage Vital Signs ED  Triage Vitals  Enc Vitals Group     BP 06/17/21 0824 (!) 134/96     Pulse Rate 06/17/21 0824 98     Resp 06/17/21 0824 16     Temp 06/17/21 0824 98.7 F (37.1 C)     Temp Source 06/17/21 0824 Oral     SpO2 06/17/21 0824 97 %     Weight 06/17/21 0820 262 lb (118.8 kg)     Height 06/17/21 0820 6\' 3"  (1.905 m)     Head Circumference --      Peak Flow --      Pain Score 06/17/21 0820 5     Pain Loc --      Pain Edu? --      Excl. in GC? --    No data found.  Updated Vital Signs BP (!) 134/96 (BP Location: Left Arm)   Pulse 98   Temp 98.7 F (37.1 C) (Oral)   Resp 16   Ht 6\' 3"  (1.905 m)   Wt 262 lb (118.8 kg)   SpO2 97%   BMI 32.75 kg/m   Visual Acuity Right Eye Distance:   Left Eye Distance:   Bilateral Distance:    Right Eye Near:  Left Eye Near:    Bilateral Near:     Physical Exam Vitals and nursing note reviewed.  Constitutional:      General: He is in acute distress.     Comments: In moderate pain who could not sit down  HENT:     Head: Normocephalic.     Right Ear: External ear normal.     Left Ear: External ear normal.  Eyes:     General: No scleral icterus.    Conjunctiva/sclera: Conjunctivae normal.  Pulmonary:     Effort: Pulmonary effort is normal.  Musculoskeletal:        General: Normal range of motion.     Cervical back: Neck supple.  Skin:    Comments: L INNER BUTTOCKS- with large mass with fluctuation in the inner area, and outer area is firm. There is mild erythema. Is very tender.   Neurological:     Mental Status: He is alert and oriented to person, place, and time.     Gait: Gait normal.  Psychiatric:        Mood and Affect: Mood normal.        Behavior: Behavior normal.        Thought Content: Thought content normal.        Judgment: Judgment normal.     UC Treatments / Results  Labs (all labs ordered are listed, but only abnormal results are displayed) Labs Reviewed  AEROBIC CULTURE W GRAM STAIN (SUPERFICIAL SPECIMEN)     EKG   Radiology No results found.  Procedures Incision and Drainage  Date/Time: 06/17/2021 9:12 AM Performed by: [name], PA-C Authorized by: [name], PA-C   Consent:    Consent obtained:  Verbal   Consent given by:  Patient   Risks discussed:  Bleeding, incomplete drainage, pain and damage to other organs   Alternatives discussed:  No treatment Universal protocol:    Procedure explained and questions answered to patient or proxy's satisfaction: yes     Relevant documents present and verified: yes     Test results available : yes     Imaging studies available: yes     Required blood products, implants, devices, and special equipment available: yes     Site/side marked: yes     Immediately prior to procedure, a time out was called: yes     Patient identity confirmed:  Verbally with patient Location:    Type:  Abscess   Size:  5 cm x 4 cm Pre-procedure details:    Skin preparation:  Betadine Anesthesia:    Anesthesia method:  Local infiltration   Local anesthetic:  Lidocaine 1% WITH epi Procedure type:    Complexity:  Complex Procedure details:    Incision types:  Single straight   Incision depth:  Subcutaneous   Wound management:  Probed and deloculated, irrigated with saline and extensive cleaning   Drainage:  Purulent and serosanguinous   Drainage amount:  Moderate   Packing materials:  1/2 in gauze Post-procedure details:    Procedure completion:  Tolerated well, no immediate complications Comments:     4x4 gauze and tape applied with tape (including critical care time)  Medications Ordered in UC Medications - No data to display  Initial Impression / Assessment and Plan / UC Course  I have reviewed the triage vital signs and the nursing notes. Abscess L buttocks. Would culture obtained. I placed him on Doxy as noted. See instructions. May come for packing change tomorrow if still has moderate  drainage on the  gauze.     Final Clinical Impressions(s) / UC Diagnoses   Final diagnoses:  Abscess of buttock, right     Discharge Instructions      Keep the dressing on today and change it later or sooner if it gets soaked. Come back to packing change tomorrow. I will be here tomorrow.      ED Prescriptions     Medication Sig Dispense Auth. Provider   doxycycline (VIBRAMYCIN) 100 MG capsule Take 1 capsule (100 mg total) by mouth 2 (two) times daily. 20 capsule Rodriguez-Southworth, Nettie Elm, PA-C      PDMP not reviewed this encounter.   [name], PA-C 06/17/21 1427

## 2021-06-17 NOTE — Discharge Instructions (Addendum)
Keep the dressing on today and change it later or sooner if it gets soaked. Come back to packing change tomorrow. I will be here tomorrow.

## 2021-06-17 NOTE — ED Triage Notes (Signed)
Patient c/o abscess on his left buttock that he noticed 2 days ago.  Patient denies any drainage.  Patient states that it is very painful.

## 2021-06-20 LAB — AEROBIC CULTURE W GRAM STAIN (SUPERFICIAL SPECIMEN)

## 2022-04-08 ENCOUNTER — Ambulatory Visit
Admission: EM | Admit: 2022-04-08 | Discharge: 2022-04-08 | Disposition: A | Discharge status: Home / Self Care | Attending: Physician Assistant | Admitting: Physician Assistant

## 2022-04-08 ENCOUNTER — Encounter: Payer: Self-pay | Admitting: Emergency Medicine

## 2022-04-08 DIAGNOSIS — J020 Streptococcal pharyngitis: Secondary | ICD-10-CM | POA: Insufficient documentation

## 2022-04-08 LAB — GROUP A STREP BY PCR: Group A Strep by PCR: DETECTED — AB

## 2022-04-08 MED ORDER — PENICILLIN G BENZATHINE 1200000 UNIT/2ML IM SUSY
1.2000 10*6.[IU] | PREFILLED_SYRINGE | Freq: Once | INTRAMUSCULAR | Status: AC
  Administered 2022-04-08: 1.2 10*6.[IU] via INTRAMUSCULAR

## 2022-04-08 NOTE — ED Provider Notes (Signed)
[facility]    CSN: [id number] Arrival date & time: 04/08/22  0804      History   Chief Complaint Chief Complaint  Patient presents with   Sore Throat   Cough   Diarrhea    HPI Gary Irwin is a 30 y.o. male who presents with onset of diarrhea 2 days ago. The only thing he ate that day was a bagel which was brought to his employer for every one. He developed diarrhea, chills and aches latera that day and thinks he had 10+ stools. But none since. Since yesterday has had mild stuffy nose and cough, and severe ST  which is causing him to have a hard time swallowing his saliva. His daughter had vomiting and URI last week. His fiance is here with ST as well. Had a fever up to 101.     Past Medical History:  Diagnosis Date   Frequent headaches    Herpes simplex type 2 infection   nose congestion, cough, chills, diarrhea and fever of    x 2 days. Developed a ST yesterday.    Patient Active Problem List   Diagnosis Date Noted   Encounter for health maintenance examination with abnormal findings 05/20/2018   Elevated BP without diagnosis of hypertension 05/20/2018   Morbid obesity (HCC) 05/20/2018   Chronic midline low back pain 05/20/2018   Genital herpes simplex 05/20/2018    Past Surgical History:  Procedure Laterality Date   BACK SURGERY     SHOULDER SURGERY Left        Home Medications    Prior to Admission medications   Medication Sig Start Date End Date Taking? Authorizing Provider  valACYclovir (VALTREX) 1000 MG tablet Take 1 tablet (1,000 mg total) by mouth daily. 05/20/18  Yes Mliss Sax, MD  doxycycline (VIBRAMYCIN) 100 MG capsule Take 1 capsule (100 mg total) by mouth 2 (two) times daily. 06/17/21   Rodriguez-Southworth, Nettie Elm, PA-C    Family History Family History  Problem Relation Age of Onset   Hypertension Mother    Hypertension Father     Social History Social History   Tobacco Use   Smoking status: Every Day     Types: Cigars   Smokeless tobacco: Never  Vaping Use   Vaping Use: Never used  Substance Use Topics   Alcohol use: Yes    Comment: occ   Drug use: No     Allergies   Latex and Shellfish-derived products   Review of Systems Review of Systems  Constitutional:  Positive for activity change, appetite change, chills and fever.  HENT:  Positive for congestion, postnasal drip, sore throat and trouble swallowing. Negative for ear discharge, ear pain and voice change.   Eyes:  Negative for discharge.  Respiratory:  Positive for cough.   Skin:  Negative for rash.  Hematological:  Positive for adenopathy.     Physical Exam Triage Vital Signs ED Triage Vitals  Enc Vitals Group     BP 04/08/22 0832 126/87     Pulse Rate 04/08/22 0832 96     Resp 04/08/22 0832 15     Temp 04/08/22 0832 99 F (37.2 C)     Temp Source 04/08/22 0832 Oral     SpO2 04/08/22 0832 98 %     Weight 04/08/22 0827 290 lb (131.5 kg)     Height 04/08/22 0827 6\' 3"  (1.905 m)     Head Circumference --      Peak Flow --  Pain Score 04/08/22 0827 8     Pain Loc --      Pain Edu? --      Excl. in GC? --    No data found.  Updated Vital Signs BP 126/87 (BP Location: Left Arm)   Pulse 96   Temp 99 F (37.2 C) (Oral)   Resp 15   Ht 6\' 3"  (1.905 m)   Wt 290 lb (131.5 kg)   SpO2 98%   BMI 36.25 kg/m   Visual Acuity Right Eye Distance:   Left Eye Distance:   Bilateral Distance:    Right Eye Near:   Left Eye Near:    Bilateral Near:     Physical Exam Vitals and nursing note reviewed.  Constitutional:      General: He is not in acute distress.    Appearance: He is obese. He is not toxic-appearing.  HENT:     Right Ear: Tympanic membrane and ear canal normal.     Left Ear: Tympanic membrane and ear canal normal.     Nose: Congestion present. No rhinorrhea.     Mouth/Throat:     Mouth: Mucous membranes are moist. No oral lesions.     Pharynx: Uvula midline. Posterior oropharyngeal erythema  present. No pharyngeal swelling.     Tonsils: Tonsillar exudate present. 2+ on the right. 2+ on the left.  Pulmonary:     Effort: Pulmonary effort is normal.     Breath sounds: Normal breath sounds.  Musculoskeletal:     Cervical back: Neck supple.  Lymphadenopathy:     Cervical: Cervical adenopathy present.  Skin:    General: Skin is warm and dry.  Neurological:     Mental Status: He is alert.  Psychiatric:        Mood and Affect: Mood normal.        Behavior: Behavior normal.      UC Treatments / Results  Labs (all labs ordered are listed, but only abnormal results are displayed) Labs Reviewed  GROUP A STREP BY PCR    EKG   Radiology No results found.  Procedures Procedures (including critical care time)  Medications Ordered in UC Medications - No data to display  Initial Impression / Assessment and Plan / UC Course  I have reviewed the triage vital signs and the nursing notes.  Pertinent labs  results that were available during my care of the patient were reviewed by me and considered in my medical decision making (see chart for details).  Has strep throat He was given the option to take oral liquid antibiotic vs getting a bicillin shot, and he chose the latter.   He was given Bicillin 1.2 million IM as noted.    See instructions   Final Clinical Impressions(s) / UC Diagnoses   Final diagnoses:  None   Discharge Instructions   None    ED Prescriptions   None    PDMP not reviewed this encounter.   Gary Irwin, [place] 04/08/22 [phone]

## 2022-06-14 ENCOUNTER — Ambulatory Visit
Payer: No Typology Code available for payment source | Attending: Student in an Organized Health Care Education/Training Program | Admitting: Student in an Organized Health Care Education/Training Program

## 2022-06-14 ENCOUNTER — Encounter: Payer: Self-pay | Admitting: Student in an Organized Health Care Education/Training Program

## 2022-06-14 VITALS — BP 127/91 | HR 67 | Temp 97.2°F | Resp 16 | Ht 75.0 in | Wt 298.9 lb

## 2022-06-14 DIAGNOSIS — M5416 Radiculopathy, lumbar region: Secondary | ICD-10-CM | POA: Diagnosis not present

## 2022-06-14 DIAGNOSIS — M961 Postlaminectomy syndrome, not elsewhere classified: Secondary | ICD-10-CM | POA: Diagnosis present

## 2022-06-14 NOTE — Progress Notes (Signed)
Safety precautions to be maintained throughout the outpatient stay will include: orient to surroundings, keep bed in low position, maintain call bell within reach at all times, provide assistance with transfer out of bed and ambulation.  

## 2022-06-14 NOTE — Progress Notes (Signed)
Patient: Gary Irwin  Service Category: E/M  Provider: Gillis Santa, MD  DOB: 1991-01-15  DOS: 06/14/2022  Referring Provider: Libby Maw  MRN: [id number]  Setting: Ambulatory outpatient  PCP: Libby Maw, MD  Type: New Patient  Specialty: Interventional Pain Management    Location: Office  Delivery: Face-to-face     Primary Reason(s) for Visit: Encounter for initial evaluation of one or more chronic problems (new to examiner) potentially causing chronic pain, and posing a threat to normal musculoskeletal function. (Level of risk: High) CC: Back Pain (lower)  HPI  Gary Irwin is a 31 y.o. year old, male patient, who comes for the first time to our practice referred by Libby Maw,* for our initial evaluation of his chronic pain. He has Encounter for health maintenance examination with abnormal findings; Elevated BP without diagnosis of hypertension; Morbid obesity ([place]); Chronic midline low back pain; Genital herpes simplex; Failed back surgical syndrome; and Lumbar radicular pain on their problem list. Today he comes in for evaluation of his Back Pain (lower)  Pain Assessment: Location: Right, Lower Back Radiating: through right hip to right shin Onset: More than a month ago Duration: Chronic pain Quality: Numbness Severity: 6 /10 (subjective, self-reported pain score)  Effect on ADL: disrupt sleep, unable to exercise Timing: Constant Modifying factors: chiropractiv, pillows to support when sleeping BP: (!) 127/91  HR: 67  Onset and Duration: Present longer than 3 months Cause of pain: Work related accident or event Severity: Getting worse, NAS-11 at its worse: 10/10, NAS-11 at its best: 6/10, NAS-11 now: 6/10, and NAS-11 on the average: 8/10 Timing: Morning, During activity or exercise, and After activity or exercise Aggravating Factors: Bending, Climbing, Intercourse (sex), Motion, Squatting, Twisting, and Walking Alleviating Factors: Stretching,  Cold packs, Lying down, Resting, Sleeping, and TENS Associated Problems: Depression, Personality changes, and Spasms Quality of Pain: Aching, Annoying, Disabling, and Shooting Previous Examinations or Tests: CT scan, MRI scan, X-rays, and Orthopedic evaluation Previous Treatments: Physical Therapy and TENS  Gary Irwin is a pleasant 31 year old male, veteran, who presents with a chief complaint of low back pain that is more pronounced on his left side than his right side which radiates down his right leg in a dermatomal fashion along his lateral thigh stopping at his ankle.  Patient states that this pain is chronic.  He has a history of lumbar spine surgery, right L4-L5 partial laminectomy at the age of 31 for what sounds like severe pain and radiculopathy.  He states that since then, he has had difficulty with his lumbar spine including pain and stiffness.  He played sports throughout high school and also served in the [employer]  He states that he worked through the pain.  He has tried various medications including acetaminophen, NSAIDs, muscle relaxers which were not helpful and he is not interested in medication management.  He has tried a TENS unit.  He is also done stretching exercises at home.  His last course of physical therapy was over 3 years ago.  He went to a chiropractor yesterday and states that his back is a little sore.  He has not done any injections.  Unfortunately, his father passed away last 12-23-22 so he is still grieving from that.  He has 3 kids and his wife is currently pregnant with his fourth son on the way.  He has gained weight, approximately 30 to 40 pounds since 12-23-2022 which is when his father passed away.  He states that he is not exercising  or engaging in any physical activity.  He was a previous smoker but has stopped.  Meds   Current Outpatient Medications:    valACYclovir (VALTREX) 1000 MG tablet, Take 1 tablet (1,000 mg total) by mouth daily., Disp: 100 tablet, Rfl:  4  Imaging Review   Low more MRI done at the [place]: November 08, 2021 T12-L1: There is no evidence of significant spinal stenosis disc bulge or neural foraminal narrowing. L1-L2: There is no significant spinal stenosis disc bulge or neuroforaminal narrowing L2-L3: Mild facet degenerative change L1-L2: There is no significant spinal stenosis disc bulge or neuroforaminal narrowing L2-L3: Mild facet degenerative change, mild bilateral neuroforaminal narrowing, small disc bulge, mild spinal canal narrowing L3-L4: Small disc bulge, moderate bilateral facet changes, ligamentum flavum hypertrophy.  Mild to moderate spinal canal narrowing.  Mild bilateral neuroforaminal narrowing. L4-L5 prior partial laminectomy at L4-L5 on the right.  Small disc bulge, moderate right and mild left facet degenerative change.  Mild narrowing of the right lateral recess.  Mild right greater than left neuroforaminal narrowing L5-S1: Mild bilateral facet degenerative changes, ligamentum flavum hypertrophy.  Mild spinal canal narrowing.  Mild bilateral neuroforaminal narrowing.  SI joints unremarkable.   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  No high-grade neuroforaminal narrowing.     Narrative *RADIOLOGY REPORT*  Clinical Data: Low back pain since playing basketball yesterday  LUMBAR SPINE - COMPLETE 4+ VIEW  Comparison: None.  Findings:  There are five non-rib bearing lumbar type vertebral bodies.  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  Normal alignment of the lumbar spine.  No anterolisthesis or retrolisthesis.  No pars defects.  Vertebral body heights and intervertebral disc spaces are preserved.  Limited visualization of bilateral SI joints is  normal.  Regional bowel gas pattern is normal.  IMPRESSION: No explanation for patient's back pain.  Original Report Authenticated By: Rachel Moulds, M.D.    Complexity Note: Imaging results reviewed.                         ROS  Cardiovascular: No reported cardiovascular signs or symptoms such as High blood pressure, coronary artery disease, abnormal heart rate or rhythm, heart attack, blood thinner therapy or heart weakness and/or failure Pulmonary or Respiratory: No reported pulmonary signs or symptoms such as wheezing and difficulty taking a deep full breath (Asthma), difficulty blowing air out (Emphysema), coughing up mucus (Bronchitis), persistent dry cough, or temporary stoppage of breathing during sleep Neurological: No reported neurological signs or symptoms such as seizures, abnormal skin sensations, urinary and/or fecal incontinence, being born with an abnormal open spine and/or a tethered spinal cord Psychological-Psychiatric: No reported psychological or psychiatric signs or symptoms such as difficulty sleeping, anxiety, depression, delusions or hallucinations (schizophrenial), mood swings (bipolar disorders) or suicidal ideations or attempts Gastrointestinal: No reported gastrointestinal signs or symptoms such as vomiting or evacuating blood, reflux, heartburn, alternating episodes of diarrhea and constipation, inflamed or scarred liver, or pancreas or irrregular and/or infrequent bowel movements Genitourinary: No reported renal or genitourinary signs or symptoms such as difficulty voiding or producing urine, peeing blood, non-functioning kidney, kidney stones, difficulty emptying the bladder, difficulty controlling the flow of urine, or chronic kidney disease Hematological: No reported hematological signs or symptoms such as prolonged bleeding, low  or poor functioning platelets, bruising or bleeding easily, hereditary bleeding problems, low energy levels due to low hemoglobin or  being anemic Endocrine: No reported endocrine signs or symptoms such as high or low blood sugar, rapid heart rate due to high thyroid levels, obesity or weight gain due to slow thyroid or thyroid disease Rheumatologic: No reported rheumatological signs and symptoms such as fatigue, joint pain, tenderness, swelling, redness, heat, stiffness, decreased range of motion, with or without associated rash Musculoskeletal: Negative for myasthenia gravis, muscular dystrophy, multiple sclerosis or malignant hyperthermia Work History: Working full time  Allergies  Gary Irwin is allergic to latex and shellfish-derived products.  Laboratory Chemistry Profile   Renal Lab Results  Component Value Date   BUN 10 12/04/2011   CREATININE 1.00 12/04/2011   GFRAA >90 12/04/2011   GFRNONAA >90 12/04/2011     Electrolytes Lab Results  Component Value Date   NA 143 12/04/2011   K 3.7 12/04/2011   CL 103 12/04/2011   CALCIUM 10.2 12/04/2011     Hepatic No results found for: "AST", "ALT", "ALBUMIN", "ALKPHOS", "AMYLASE", "LIPASE", "AMMONIA"   ID No results found for: "LYMEIGGIGMAB", "HIV", "SARSCOV2NAA", "STAPHAUREUS", "MRSAPCR", "HCVAB", "PREGTESTUR", "RMSFIGG", "QFVRPH1IGG", "QFVRPH2IGG"   Bone No results found for: "VD25OH", "VD125OH2TOT", "KG8811SR1", "RX4585FY9", "25OHVITD1", "25OHVITD2", "25OHVITD3", "TESTOFREE", "TESTOSTERONE"   Endocrine Lab Results  Component Value Date   GLUCOSE 102 (H) 12/04/2011     Neuropathy No results found for: "VITAMINB12", "FOLATE", "HGBA1C", "HIV"   CNS No results found for: "COLORCSF", "APPEARCSF", "RBCCOUNTCSF", "WBCCSF", "POLYSCSF", "LYMPHSCSF", "EOSCSF", "PROTEINCSF", "GLUCCSF", "JCVIRUS", "CSFOLI", "IGGCSF", "LABACHR", "ACETBL"   Inflammation (CRP: Acute  ESR: Chronic) No results found for: "CRP", "ESRSEDRATE", "LATICACIDVEN"   Rheumatology No results found for: "RF", "ANA", "LABURIC", "URICUR", "LYMEIGGIGMAB", "LYMEABIGMQN", "HLAB27"    Coagulation Lab Results  Component Value Date   PLT 277 12/04/2011     Cardiovascular Lab Results  Component Value Date   HGB 16.3 12/04/2011   HCT 48.0 12/04/2011     Screening No results found for: "SARSCOV2NAA", "COVIDSOURCE", "STAPHAUREUS", "MRSAPCR", "HCVAB", "HIV", "PREGTESTUR"   Cancer No results found for: "CEA", "CA125", "LABCA2"   Allergens No results found for: "ALMOND", "APPLE", "ASPARAGUS", "AVOCADO", "BANANA", "BARLEY", "BASIL", "BAYLEAF", "GREENBEAN", "LIMABEAN", "WHITEBEAN", "BEEFIGE", "REDBEET", "BLUEBERRY", "BROCCOLI", "CABBAGE", "MELON", "CARROT", "CASEIN", "CASHEWNUT", "CAULIFLOWER", "CELERY"     Note: Lab results reviewed.  [place]  Drug: Gary Irwin  reports no history of drug use. Alcohol:  reports current alcohol use. Tobacco:  reports that he has quit smoking. His smoking use included cigars. He has never used smokeless tobacco. Medical:  has a past medical history of Arthritis, Asthma, Depression, Frequent headaches, Herpes simplex type 2 infection, and Sleep apnea. Family: family history includes Hypertension in his father and mother.  Past Surgical History:  Procedure Laterality Date   BACK SURGERY     SHOULDER SURGERY Left    Active Ambulatory Problems    Diagnosis Date Noted   Encounter for health maintenance examination with abnormal findings 05/20/2018   Elevated BP without diagnosis of hypertension 05/20/2018   Morbid obesity ([place]) 05/20/2018   Chronic midline low back pain 05/20/2018   Genital herpes simplex 05/20/2018   Failed back surgical syndrome 06/14/2022   Lumbar radicular pain 06/14/2022   Resolved Ambulatory Problems    Diagnosis Date Noted   No Resolved Ambulatory Problems   Past Medical History:  Diagnosis Date   Arthritis    Asthma    Depression    Frequent headaches    Herpes simplex type  2 infection    Sleep apnea    Constitutional Exam  General appearance: Well nourished, well developed, and well hydrated. In no  apparent acute distress Vitals:   06/14/22 0907  BP: (!) 127/91  Pulse: 67  Resp: 16  Temp: (!) 97.2 F (36.2 C)  TempSrc: Temporal  SpO2: 100%  Weight: 298 lb 14.4 oz (135.6 kg)  Height: $Remove'6\' 3"'cazOscj$  (1.905 m)   BMI Assessment: Estimated body mass index is 37.36 kg/m as calculated from the following:   Height as of this encounter: $RemoveBeforeD'6\' 3"'dCHTdpujCJiCGy$  (1.905 m).   Weight as of this encounter: 298 lb 14.4 oz (135.6 kg).  BMI interpretation table: BMI level Category Range association with higher incidence of chronic pain  <18 kg/m2 Underweight   18.5-24.9 kg/m2 Ideal body weight   25-29.9 kg/m2 Overweight Increased incidence by 20%  30-34.9 kg/m2 Obese (Class I) Increased incidence by 68%  35-39.9 kg/m2 Severe obesity (Class II) Increased incidence by 136%  >40 kg/m2 Extreme obesity (Class III) Increased incidence by 254%   Patient's current BMI Ideal Body weight  Body mass index is 37.36 kg/m. Ideal body weight: 84.5 kg (186 lb 4.6 oz) Adjusted ideal body weight: 104.9 kg (231 lb 5.3 oz)   BMI Readings from Last 4 Encounters:  06/14/22 37.36 kg/m  04/08/22 36.25 kg/m  06/17/21 32.75 kg/m  05/20/18 32.87 kg/m   Wt Readings from Last 4 Encounters:  06/14/22 298 lb 14.4 oz (135.6 kg)  04/08/22 290 lb (131.5 kg)  06/17/21 262 lb (118.8 kg)  05/20/18 270 lb (122.5 kg)    Psych/Mental status: Alert, oriented x 3 (person, place, & time)       Eyes: PERLA Respiratory: No evidence of acute respiratory distress  Thoracic Spine Area Exam  Skin & Axial Inspection: No masses, redness, or swelling Alignment: Symmetrical Functional ROM: Unrestricted ROM Stability: No instability detected Muscle Tone/Strength: Functionally intact. No obvious neuro-muscular anomalies detected. Sensory (Neurological): Unimpaired Muscle strength & Tone: No palpable anomalies Lumbar Spine Area Exam  Skin & Axial Inspection: Well healed scar from previous spine surgery detected small incision for right L4-L5  partial laminectomy Alignment: Symmetrical Functional ROM: Pain restricted ROM       Stability: No instability detected Muscle Tone/Strength: Functionally intact. No obvious neuro-muscular anomalies detected. Sensory (Neurological): Dermatomal pain pattern right L4-L5 Palpation: No palpable anomalies       Provocative Tests: Hyperextension/rotation test: deferred today       Lumbar quadrant test (Kemp's test): (+) on the right for foraminal stenosis Lateral bending test: (+) ipsilateral radicular pain, on the right. Positive for right-sided foraminal stenosis.  Gait & Posture Assessment  Ambulation: Unassisted Gait: Relatively normal for age and body habitus Posture: WNL   Lower Extremity Exam    Side: Right lower extremity  Side: Left lower extremity  Stability: No instability observed          Stability: No instability observed          Skin & Extremity Inspection: Skin color, temperature, and hair growth are WNL. No peripheral edema or cyanosis. No masses, redness, swelling, asymmetry, or associated skin lesions. No contractures.  Skin & Extremity Inspection: Skin color, temperature, and hair growth are WNL. No peripheral edema or cyanosis. No masses, redness, swelling, asymmetry, or associated skin lesions. No contractures.  Functional ROM: Unrestricted ROM                  Functional ROM: Unrestricted ROM  Muscle Tone/Strength: Functionally intact. No obvious neuro-muscular anomalies detected.  Muscle Tone/Strength: Functionally intact. No obvious neuro-muscular anomalies detected.  Sensory (Neurological): Neurogenic pain pattern lateral aspect of foot (S1)  Sensory (Neurological): Unimpaired        DTR: Patellar: deferred today Achilles: deferred today Plantar: deferred today  DTR: Patellar: deferred today Achilles: deferred today Plantar: deferred today  Palpation: No palpable anomalies  Palpation: No palpable anomalies    Assessment  Primary Diagnosis &  Pertinent Problem List: The primary encounter diagnosis was Lumbar post-laminectomy syndrome (right l4/5). Diagnoses of Lumbar radicular pain and Failed back surgical syndrome (right l4/5 partial laminectomy at age 50) were also pertinent to this visit.  Visit Diagnosis (New problems to examiner): 1. Lumbar post-laminectomy syndrome (right l4/5)   2. Lumbar radicular pain   3. Failed back surgical syndrome (right l4/5 partial laminectomy at age 25)    Plan of Care (Initial workup plan)  General Recommendations: The pain condition that the patient suffers from is best treated with a multidisciplinary approach that involves an increase in physical activity to prevent de-conditioning and worsening of the pain cycle, as well as psychological counseling (formal and/or informal) to address the co-morbid psychological affects of pain. Treatment will often involve judicious use of pain medications and interventional procedures to decrease the pain, allowing the patient to participate in the physical activity that will ultimately produce long-lasting pain reductions. The goal of the multidisciplinary approach is to return the patient to a higher level of overall function and to restore their ability to perform activities of daily living.  Gary Irwin is experiencing right lumbar radicular pain in the context of having a right L4-L5 partial laminectomy at the age of 43.  He is not interested in nonopioid based med management.  He has tried various medications in the past with limited response.  I discussed various interventional options as well as physical therapy for his condition.  It has been greater than 1 year since he has participated in physical therapy.  He is not exercising and encouraged him to at least walk to help activate his lumbar paraspinal and leg muscles.  We discussed epidural steroid injections, lumbar facet medial branch nerve blocks for his condition.  He does have neuroforaminal stenosis and  lateral recess stenosis on the right at L4-L5.  He also has facet changes bilaterally, right greater than left L3, L4, L5.  We also discussed spinal cord stimulation for failed back surgical syndrome.  Patient states that he works for [employer] and will also inquire about spinal cord stimulation further within his company.  After discussion, patient would like to trial a right L4-L5 transforaminal lumbar epidural steroid injection.  I would also like for him to engage in physical therapy for at least 4 to 6 weeks.  Afterwards he can continue those exercises at home.  Future considerations would include interlaminar epidural steroid injection, diagnostic medial branch nerve blocks, spinal cord stim trial.  1. Lumbar post-laminectomy syndrome (right l4/5) - Lumbar Transforaminal Epidural; Future - Ambulatory referral to Physical Therapy  2. Lumbar radicular pain - Lumbar Transforaminal Epidural; Future - Ambulatory referral to Physical Therapy  3. Failed back surgical syndrome (right l4/5 partial laminectomy at age 70) - Ambulatory referral to Physical Therapy -Consider SCS trial   Referral Orders         Ambulatory referral to Physical Therapy     Procedure Orders         Lumbar Transforaminal Epidural  Pharmacological management options:  Not applicable.  Patient not interested in nonopioid-based med management.  Has failed multiple medication trials in the past.  Interventional options Future considerations would include interlaminar epidural steroid injection, diagnostic medial branch nerve blocks, spinal cord stim trial.  Provider-requested follow-up: Return in about 2 weeks (around 06/28/2022) for R L4/5 TF ESI.  I spent a total of 60 minutes reviewing chart data, face-to-face evaluation with the patient, counseling and coordination of care as detailed above.   No future appointments.  Note by: Gillis Santa, MD Date: 06/14/2022; Time: 9:56 AM

## 2022-06-14 NOTE — Patient Instructions (Signed)
GENERAL RISKS AND COMPLICATIONS  What are the risk, side effects and possible complications? Generally speaking, most procedures are safe.  However, with any procedure there are risks, side effects, and the possibility of complications.  The risks and complications are dependent upon the sites that are lesioned, or the type of nerve block to be performed.  The closer the procedure is to the spine, the more serious the risks are.  Great care is taken when placing the radio frequency needles, block needles or lesioning probes, but sometimes complications can occur. Infection: Any time there is an injection through the skin, there is a risk of infection.  This is why sterile conditions are used for these blocks.  There are four possible types of infection. Localized skin infection. Central Nervous System Infection-This can be in the form of Meningitis, which can be deadly. Epidural Infections-This can be in the form of an epidural abscess, which can cause pressure inside of the spine, causing compression of the spinal cord with subsequent paralysis. This would require an emergency surgery to decompress, and there are no guarantees that the patient would recover from the paralysis. Discitis-This is an infection of the intervertebral discs.  It occurs in about 1% of discography procedures.  It is difficult to treat and it may lead to surgery.        2. Pain: the needles have to go through skin and soft tissues, will cause soreness.       3. Damage to internal structures:  The nerves to be lesioned may be near blood vessels or    other nerves which can be potentially damaged.       4. Bleeding: Bleeding is more common if the patient is taking blood thinners such as  aspirin, Coumadin, Ticiid, Plavix, etc., or if he/she have some genetic predisposition  such as hemophilia. Bleeding into the spinal canal can cause compression of the spinal  cord with subsequent paralysis.  This would require an emergency  surgery to  decompress and there are no guarantees that the patient would recover from the  paralysis.       5. Pneumothorax:  Puncturing of a lung is a possibility, every time a needle is introduced in  the area of the chest or upper back.  Pneumothorax refers to free air around the  collapsed lung(s), inside of the thoracic cavity (chest cavity).  Another two possible  complications related to a similar event would include: Hemothorax and Chylothorax.   These are variations of the Pneumothorax, where instead of air around the collapsed  lung(s), you may have blood or chyle, respectively.       6. Spinal headaches: They may occur with any procedures in the area of the spine.       7. Persistent CSF (Cerebro-Spinal Fluid) leakage: This is a rare problem, but may occur  with prolonged intrathecal or epidural catheters either due to the formation of a fistulous  track or a dural tear.       8. Nerve damage: By working so close to the spinal cord, there is always a possibility of  nerve damage, which could be as serious as a permanent spinal cord injury with  paralysis.       9. Death:  Although rare, severe deadly allergic reactions known as "Anaphylactic  reaction" can occur to any of the medications used.      10. Worsening of the symptoms:  We can always make thing worse.  What are the chances   of something like this happening? Chances of any of this occuring are extremely low.  By statistics, you have more of a chance of getting killed in a motor vehicle accident: while driving to the hospital than any of the above occurring .  Nevertheless, you should be aware that they are possibilities.  In general, it is similar to taking a shower.  Everybody knows that you can slip, hit your head and get killed.  Does that mean that you should not shower again?  Nevertheless always keep in mind that statistics do not mean anything if you happen to be on the wrong side of them.  Even if a procedure has a 1 (one) in a  1,000,000 (million) chance of going wrong, it you happen to be that one..Also, keep in mind that by statistics, you have more of a chance of having something go wrong when taking medications.  Who should not have this procedure? If you are on a blood thinning medication (e.g. Coumadin, Plavix, see list of "Blood Thinners"), or if you have an active infection going on, you should not have the procedure.  If you are taking any blood thinners, please inform your physician.  How should I prepare for this procedure? Do not eat or drink anything at least six hours prior to the procedure. Bring a driver with you .  It cannot be a taxi. Come accompanied by an adult that can drive you back, and that is strong enough to help you if your legs get weak or numb from the local anesthetic. Take all of your medicines the morning of the procedure with just enough water to swallow them. If you have diabetes, make sure that you are scheduled to have your procedure done first thing in the morning, whenever possible. If you have diabetes, take only half of your insulin dose and notify our nurse that you have done so as soon as you arrive at the clinic. If you are diabetic, but only take blood sugar pills (oral hypoglycemic), then do not take them on the morning of your procedure.  You may take them after you have had the procedure. Do not take aspirin or any aspirin-containing medications, at least eleven (11) days prior to the procedure.  They may prolong bleeding. Wear loose fitting clothing that may be easy to take off and that you would not mind if it got stained with Betadine or blood. Do not wear any jewelry or perfume Remove any nail coloring.  It will interfere with some of our monitoring equipment.  NOTE: Remember that this is not meant to be interpreted as a complete list of all possible complications.  Unforeseen problems may occur.  BLOOD THINNERS The following drugs contain aspirin or other products,  which can cause increased bleeding during surgery and should not be taken for 2 weeks prior to and 1 week after surgery.  If you should need take something for relief of minor pain, you may take acetaminophen which is found in Tylenol,m Datril, Anacin-3 and Panadol. It is not blood thinner. The products listed below are.  Do not take any of the products listed below in addition to any listed on your instruction sheet.  A.P.C or A.P.C with Codeine Codeine Phosphate Capsules #3 Ibuprofen Ridaura  ABC compound Congesprin Imuran rimadil  Advil Cope Indocin Robaxisal  Alka-Seltzer Effervescent Pain Reliever and Antacid Coricidin or Coricidin-D  Indomethacin Rufen  Alka-Seltzer plus Cold Medicine Cosprin Ketoprofen S-A-C Tablets  Anacin Analgesic Tablets or Capsules Coumadin   Korlgesic Salflex  Anacin Extra Strength Analgesic tablets or capsules CP-2 Tablets Lanoril Salicylate  Anaprox Cuprimine Capsules Levenox Salocol  Anexsia-D Dalteparin Magan Salsalate  Anodynos Darvon compound Magnesium Salicylate Sine-off  Ansaid Dasin Capsules Magsal Sodium Salicylate  Anturane Depen Capsules Marnal Soma  APF Arthritis pain formula Dewitt's Pills Measurin Stanback  Argesic Dia-Gesic Meclofenamic Sulfinpyrazone  Arthritis Bayer Timed Release Aspirin Diclofenac Meclomen Sulindac  Arthritis pain formula Anacin Dicumarol Medipren Supac  Analgesic (Safety coated) Arthralgen Diffunasal Mefanamic Suprofen  Arthritis Strength Bufferin Dihydrocodeine Mepro Compound Suprol  Arthropan liquid Dopirydamole Methcarbomol with Aspirin Synalgos  ASA tablets/Enseals Disalcid Micrainin Tagament  Ascriptin Doan's Midol Talwin  Ascriptin A/D Dolene Mobidin Tanderil  Ascriptin Extra Strength Dolobid Moblgesic Ticlid  Ascriptin with Codeine Doloprin or Doloprin with Codeine Momentum Tolectin  Asperbuf Duoprin Mono-gesic Trendar  Aspergum Duradyne Motrin or Motrin IB Triminicin  Aspirin plain, buffered or enteric coated  Durasal Myochrisine Trigesic  Aspirin Suppositories Easprin Nalfon Trillsate  Aspirin with Codeine Ecotrin Regular or Extra Strength Naprosyn Uracel  Atromid-S Efficin Naproxen Ursinus  Auranofin Capsules Elmiron Neocylate Vanquish  Axotal Emagrin Norgesic Verin  Azathioprine Empirin or Empirin with Codeine Normiflo Vitamin E  Azolid Emprazil Nuprin Voltaren  Bayer Aspirin plain, buffered or children's or timed BC Tablets or powders Encaprin Orgaran Warfarin Sodium  Buff-a-Comp Enoxaparin Orudis Zorpin  Buff-a-Comp with Codeine Equegesic Os-Cal-Gesic   Buffaprin Excedrin plain, buffered or Extra Strength Oxalid   Bufferin Arthritis Strength Feldene Oxphenbutazone   Bufferin plain or Extra Strength Feldene Capsules Oxycodone with Aspirin   Bufferin with Codeine Fenoprofen Fenoprofen Pabalate or Pabalate-SF   Buffets II Flogesic Panagesic   Buffinol plain or Extra Strength Florinal or Florinal with Codeine Panwarfarin   Buf-Tabs Flurbiprofen Penicillamine   Butalbital Compound Four-way cold tablets Penicillin   Butazolidin Fragmin Pepto-Bismol   Carbenicillin Geminisyn Percodan   Carna Arthritis Reliever Geopen Persantine   Carprofen Gold's salt Persistin   Chloramphenicol Goody's Phenylbutazone   Chloromycetin Haltrain Piroxlcam   Clmetidine heparin Plaquenil   Cllnoril Hyco-pap Ponstel   Clofibrate Hydroxy chloroquine Propoxyphen         Before stopping any of these medications, be sure to consult the physician who ordered them.  Some, such as Coumadin (Warfarin) are ordered to prevent or treat serious conditions such as "deep thrombosis", "pumonary embolisms", and other heart problems.  The amount of time that you may need off of the medication may also vary with the medication and the reason for which you were taking it.  If you are taking any of these medications, please make sure you notify your pain physician before you undergo any procedures.         Selective Nerve Root  Block Patient Information  Description: Specific nerve roots exit the spinal canal and these nerves can be compressed and inflamed by a bulging disc and bone spurs.  By injecting steroids on the nerve root, we can potentially decrease the inflammation surrounding these nerves, which often leads to decreased pain.  Also, by injecting local anesthesia on the nerve root, this can provide us helpful information to give to your referring doctor if it decreases your pain.  Selective nerve root blocks can be done along the spine from the neck to the low back depending on the location of your pain.   After numbing the skin with local anesthesia, a small needle is passed to the nerve root and the position of the needle is verified using x-ray pictures.  After the needle is   in correct position, we then deposit the medication.  You may experience a pressure sensation while this is being done.  The entire block usually lasts less than 15 minutes.  Conditions that may be treated with selective nerve root blocks: Low back and leg pain Spinal stenosis Diagnostic block prior to potential surgery Neck and arm pain Post laminectomy syndrome  Preparation for the injection:  Do not eat any solid food or dairy products within 8 hours of your appointment. You may drink clear liquids up to 3 hours before an appointment.  Clear liquids include water, black coffee, juice or soda.  No milk or cream please. You may take your regular medications, including pain medications, with a sip of water before your appointment.  Diabetics should hold regular insulin (if taken separately) and take 1/2 normal NPH dose the morning of the procedure.  Carry some sugar containing items with you to your appointment. A driver must accompany you and be prepared to drive you home after your procedure. Bring all your current medications with you. An IV may be inserted and sedation may be given at the discretion of the physician. A blood  pressure cuff, EKG, and other monitors will often be applied during the procedure.  Some patients may need to have extra oxygen administered for a short period. You will be asked to provide medical information, including allergies, prior to the procedure.  We must know immediately if you are taking blood  Thinners (like Coumadin) or if you are allergic to IV iodine contrast (dye).  Possible side-effects: All are usually temporary Bleeding from needle site Light headedness Numbness and tingling Decreased blood pressure Weakness in arms/legs Pressure sensation in back/neck Pain at injection site (several days)  Possible complications: All are extremely rare Infection Nerve injury Spinal headache (a headache wore with upright position)  Call if you experience: Fever/chills associated with headache or increased back/neck pain Headache worsened by an upright position New onset weakness or numbness of an extremity below the injection site Hives or difficulty breathing (go to the emergency room) Inflammation or drainage at the injection site(s) Severe back/neck pain greater than usual New symptoms which are concerning to you  Please note:  Although the local anesthetic injected can often make your back or neck feel good for several hours after the injection the pain will likely return.  It takes 3-5 days for steroids to work on the nerve root. You may not notice any pain relief for at least one week.  If effective, we will often do a series of 3 injections spaced 3-6 weeks apart to maximally decrease your pain.    If you have any questions, please call [phone] [facility] 

## 2023-07-10 ENCOUNTER — Ambulatory Visit: Payer: No Typology Code available for payment source

## 2023-07-10 ENCOUNTER — Ambulatory Visit
Admission: EM | Admit: 2023-07-10 | Discharge: 2023-07-10 | Disposition: A | Discharge status: Home / Self Care | Payer: 59

## 2023-07-10 DIAGNOSIS — S63632A Sprain of interphalangeal joint of right middle finger, initial encounter: Secondary | ICD-10-CM | POA: Diagnosis not present

## 2023-07-10 NOTE — ED Provider Notes (Signed)
[facility]    CSN: [id number] Arrival date & time: 07/10/23  1528      History   Chief Complaint Chief Complaint  Patient presents with   Hand Injury    HPI Gary Irwin is a 32 y.o. male.   HPI  32 year old male with past medical history significant for sleep apnea, headaches, depression, asthma, and arthritis presents for evaluation of pain and limited range of motion to his right middle finger.  He reports that 2 weeks ago he was bowling and he felt something pop and indicated that there was a piece of the finger that was out of place.  He pushed it back into place and continued to bowl.  He was bowling again last night and afterwards he had increased pain and also limited range of motion he cannot fully flex or extend his finger.  He also had numbness and tingling last night but that has resolved.  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 Authorizing Provider  busPIRone (BUSPAR) 10 MG tablet TAKE ONE TABLET BY MOUTH EVERY DAY FOR ANXIETY 11/29/22 11/30/23 Yes [provider]  traZODone (DESYREL) 100 MG tablet Take by mouth.   Yes [provider]  valACYclovir (VALTREX) 1000 MG tablet Take 1 tablet (1,000 mg total) by mouth daily. 05/20/18  Yes  Mliss Sax, MD    Family History Family History  Problem Relation Age of Onset   Hypertension Mother    Hypertension Father     Social History Social History   Tobacco Use   Smoking status: Former    Types: Cigars   Smokeless tobacco: Never  Vaping Use   Vaping status: Every Day  Substance Use Topics   Alcohol use: Yes    Comment: occ   Drug use: No     Allergies   Latex and Shellfish-derived products   Review of Systems Review of Systems  Musculoskeletal:  Positive for arthralgias and joint swelling.  Skin:  Negative for color change.  Neurological:  Positive for numbness. Negative for weakness.     Physical Exam Triage Vital Signs ED Triage Vitals [07/10/23 1554]  Encounter Vitals Group     BP      Systolic BP Percentile      Diastolic BP Percentile      Pulse      Resp      Temp      Temp src      SpO2      Weight 292 lb (132.5 kg)     Height      Head Circumference      Peak Flow      Pain Score 0  Pain Loc      Pain Education      Exclude from Growth Chart    No data found.  Updated Vital Signs BP (!) 132/91 (BP Location: Left Arm)   Pulse 72   Temp 98.7 F (37.1 C) (Oral)   Resp 17   Wt 292 lb (132.5 kg)   SpO2 98%   BMI 36.50 kg/m   Visual Acuity Right Eye Distance:   Left Eye Distance:   Bilateral Distance:    Right Eye Near:   Left Eye Near:    Bilateral Near:     Physical Exam Vitals and nursing note reviewed.  Constitutional:      Appearance: Normal appearance. He is not ill-appearing.  HENT:     Head: Normocephalic and atraumatic.  Musculoskeletal:        General: Swelling, tenderness and signs of injury present. No deformity.  Skin:    General: Skin is warm and dry.     Capillary Refill: Capillary refill takes less than 2 seconds.     Findings: No bruising or erythema.  Neurological:     General: No focal deficit present.     Mental Status: He is alert and oriented to person, place, and time.       UC Treatments / Results  Labs (all labs ordered are listed, but only abnormal results are displayed) Labs Reviewed - No data to display  EKG   Radiology No results found.  Procedures Procedures (including critical care time)  Medications Ordered in UC Medications - No data to display  Initial Impression / Assessment and Plan / UC Course  I have reviewed the triage vital signs and the nursing notes.  Pertinent labs & imaging results that were available during my care of the patient were reviewed by me and considered in my medical decision making (see chart for details).   Patient is a nontoxic-appearing 32 year old male presenting for evaluation of pain at the PIP joint of his right middle finger.  There is some mild edema on exam as well as mild tenderness with palpation of the distal phalanx but not at the DIP joint, middle phalanx, PIP joint, phalanx, or MCP.  I am able to passively fully extend his middle finger he reports that he feels all pulling on the dorsal medial aspect of his finger.  He is unable to completely close his right middle finger secondary to pain and swelling.  I am concerned that he may possibly have a tendon injury.  He reports that he did have a deformity after he felt a pop while bowling 2 weeks ago.  He did continue to bowl and the pain intensified last night.  I will obtain a radiograph to rule out any bony abnormality and refer the patient to hand.  Radiographs of right middle finger independently reviewed and evaluated by me.  Impression: No evidence of fracture or dislocation.  Soft tissues are unremarkable.  Radiology overread is pending.  I will have the staff place the patient in an aluminum finger splint and discharged him home with a diagnosis of right middle finger sprain.  I will make a referral to hand surgery for evaluation.  Final Clinical Impressions(s) / UC Diagnoses   Final diagnoses:  Sprain of interphalangeal joint of right middle  finger, initial encounter     Discharge Instructions      Your x-rays tonight did not show any evidence of broken or dislocated bones.  These however do not show soft tissue  and you may have a ligamentous or tendon injury.  Wear the finger splint to prevent any further injury to your right middle finger.  Abstain from bowling until after your pain has resolved and until after you have been cleared by hand surgery.  I have made a referral to hand surgery, they will call you to schedule appointment.  If you develop any new or worsening symptoms either return for reevaluation or see your PCP.     ED Prescriptions   None    PDMP not reviewed this encounter.   Becky Augusta, NP 07/10/23 [phone]

## 2023-07-10 NOTE — ED Triage Notes (Signed)
Patient states that he hurt his right middle finger 2 weeks ago bowling and made it worse last night bowling. Cant bend finger in.

## 2023-07-10 NOTE — Discharge Instructions (Addendum)
Your x-rays tonight did not show any evidence of broken or dislocated bones.  These however do not show soft tissue and you may have a ligamentous or tendon injury.  Wear the finger splint to prevent any further injury to your right middle finger.  Abstain from bowling until after your pain has resolved and until after you have been cleared by hand surgery.  I have made a referral to hand surgery, they will call you to schedule appointment.  If you develop any new or worsening symptoms either return for reevaluation or see your PCP.

## 2023-10-22 ENCOUNTER — Ambulatory Visit
Admission: RE | Admit: 2023-10-22 | Discharge: 2023-10-22 | Disposition: A | Discharge status: Home / Self Care | Payer: No Typology Code available for payment source | Source: Ambulatory Visit | Attending: Student in an Organized Health Care Education/Training Program | Admitting: Student in an Organized Health Care Education/Training Program

## 2023-10-22 ENCOUNTER — Ambulatory Visit
Payer: No Typology Code available for payment source | Admitting: Student in an Organized Health Care Education/Training Program

## 2023-10-22 ENCOUNTER — Other Ambulatory Visit: Payer: Self-pay | Admitting: Student in an Organized Health Care Education/Training Program

## 2023-10-22 ENCOUNTER — Encounter: Payer: Self-pay | Admitting: Student in an Organized Health Care Education/Training Program

## 2023-10-22 VITALS — BP 128/85 | HR 82 | Temp 97.3°F | Resp 16 | Ht 75.0 in | Wt 278.0 lb

## 2023-10-22 DIAGNOSIS — M5134 Other intervertebral disc degeneration, thoracic region: Secondary | ICD-10-CM | POA: Insufficient documentation

## 2023-10-22 DIAGNOSIS — M5416 Radiculopathy, lumbar region: Secondary | ICD-10-CM | POA: Insufficient documentation

## 2023-10-22 DIAGNOSIS — M961 Postlaminectomy syndrome, not elsewhere classified: Secondary | ICD-10-CM | POA: Diagnosis present

## 2023-10-22 DIAGNOSIS — R52 Pain, unspecified: Secondary | ICD-10-CM

## 2023-10-22 NOTE — Progress Notes (Signed)
 PROVIDER NOTE: Information contained herein reflects review and annotations entered in association with encounter. Interpretation of such information and data should be left to medically-trained personnel. Information provided to patient can be located elsewhere in the medical record under Patient Instructions. Document created using STT-dictation technology, any transcriptional errors that may result from process are unintentional.    Patient: Gary Irwin  Service Category: E/M  Provider: Wallie Sherry, MD  DOB: 1991-07-09  DOS: 10/22/2023  Referring Provider: [facility]  MRN: [id number]  Specialty: Interventional Pain Management  PCP: [facility]  Type: Established Patient  Setting: Ambulatory outpatient    Location: Office  Delivery: Face-to-face     HPI  Mr. Gary Irwin, a 33 y.o. year old male, is here today because of his Lumbar post-laminectomy syndrome [M96.1]. Gary Irwin primary complain today is Back Pain  Pain Assessment: Severity of Chronic pain is reported as a 6 /10. Location: Back Right, Left (alternating right and left sides - worse RIGHT side)/in the past has radiated to right foot. Onset: More than a month ago. Quality: Aching, Sharp, Shooting. Timing:  . Modifying factor(s): frequent rest. Vitals:  height is 6' 3 (1.905 m) and weight is 278 lb (126.1 kg). His temperature is 97.3 F (36.3 C) (abnormal). His blood pressure is 128/85 and his pulse is 82. His respiration is 16 and oxygen saturation is 99%.  BMI: Estimated body mass index is 34.75 kg/m as calculated from the following:   Height as of this encounter: 6' 3 (1.905 m).   Weight as of this encounter: 278 lb (126.1 kg). Last encounter: Visit date not found. Last procedure: Visit date not found.  Reason for encounter:  discuss SCS  Discussed the use of AI scribe software for clinical note transcription with the patient, who gave verbal consent to proceed.  History of Present  Illness   The patient, known with chronic back pain, reports a steady and constant pain level that has remained unchanged for years. The pain is described as aching, particularly exacerbated by weather changes such as rain. Despite the persistent discomfort, there have been no significant changes or worsening of symptoms since the last consultation.  The patient has been considering the use of a spinal stimulator for pain management. This consideration was prompted by discussions with others who have had varying degrees of success with the device. The patient expresses hope that the stimulator might allow the muscles to function more effectively, even during periods of inactivity.  The patient underwent an MRI of the lumbar spine at the [facility] in November.        ROS  Constitutional: Denies any fever or chills Gastrointestinal: No reported hemesis, hematochezia, vomiting, or acute GI distress Musculoskeletal:  as above Neurological:  as above  Medication Review  B-12, busPIRone, multivitamin, traZODone, and valACYclovir   History Review  Allergy: Gary Irwin is allergic to latex and shellfish-derived products. Drug: Gary Irwin  reports no history of drug use. Alcohol:  reports current alcohol use. Tobacco:  reports that he has quit smoking. His smoking use included cigars. He has never used smokeless tobacco. Social: Gary Irwin  reports that he has quit smoking. His smoking use included cigars. He has never used smokeless tobacco. He reports current alcohol use. He reports that he does not use drugs. Medical:  has a past medical history of Arthritis, Asthma, Depression, Frequent headaches, Herpes simplex type 2 infection, and Sleep apnea. Surgical: Gary Irwin  has a past surgical history that includes Back  surgery and Shoulder surgery (Left). Family: family history includes Hypertension in his father and mother.  Laboratory Chemistry Profile   Renal Lab Results  Component Value Date   BUN  10 12/04/2011   CREATININE 1.00 12/04/2011   GFRAA >90 12/04/2011   GFRNONAA >90 12/04/2011    Hepatic No results found for: AST, ALT, ALBUMIN, ALKPHOS, HCVAB, AMYLASE, LIPASE, AMMONIA  Electrolytes Lab Results  Component Value Date   NA 143 12/04/2011   K 3.7 12/04/2011   CL 103 12/04/2011   CALCIUM 10.2 12/04/2011    Bone No results found for: VD25OH, VD125OH2TOT, CI6874NY7, CI7874NY7, 25OHVITD1, 25OHVITD2, 25OHVITD3, TESTOFREE, TESTOSTERONE  Inflammation (CRP: Acute Phase) (ESR: Chronic Phase) No results found for: CRP, ESRSEDRATE, LATICACIDVEN       Note: Above Lab results reviewed.  Recent Imaging Review  DG Finger Middle Right CLINICAL DATA:  Finger injury bowling. Pain at the PIP joint with limited range of motion.  EXAM: RIGHT MIDDLE FINGER 2+V  COMPARISON:  None Available.  FINDINGS: The mineralization and alignment are normal. There is no evidence of acute fracture or dislocation. The joint spaces are preserved. Possible mild soft tissue swelling around the proximal interphalangeal joint. No evidence of foreign body or soft tissue emphysema.  IMPRESSION: No evidence of acute fracture or dislocation. Possible mild soft tissue swelling.  Electronically Signed   By: Elsie Perone M.D.   On: 07/10/2023 17:41 Lumbar MRI 08/27/2023 1.  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  Unchanged multilevel foraminal stenosis which is greatest on the right at L5-S1 where it is mild to moderate 3.  Unchanged mild narrowing of the right lateral recess at L4-L5  Note: Reviewed        Physical Exam  General appearance: Well nourished, well developed, and well hydrated. In no apparent acute distress Mental status: Alert, oriented x 3 (person, place, & time)       Respiratory: No evidence of acute respiratory  distress Eyes: PERLA Vitals: BP 128/85   Pulse 82   Temp (!) 97.3 F (36.3 C)   Resp 16   Ht 6' 3 (1.905 m)   Wt 278 lb (126.1 kg)   SpO2 99%   BMI 34.75 kg/m  BMI: Estimated body mass index is 34.75 kg/m as calculated from the following:   Height as of this encounter: 6' 3 (1.905 m).   Weight as of this encounter: 278 lb (126.1 kg). Ideal: Ideal body weight: 84.5 kg (186 lb 4.6 oz) Adjusted ideal body weight: 101.1 kg (222 lb 15.6 oz)  Thoracic Spine Area Exam  Skin & Axial Inspection: No masses, redness, or swelling Alignment: Symmetrical Functional ROM: Unrestricted ROM Stability: No instability detected Muscle Tone/Strength: Functionally intact. No obvious neuro-muscular anomalies detected. Sensory (Neurological): Unimpaired Muscle strength & Tone: No palpable anomalies Lumbar Spine Area Exam  Skin & Axial Inspection: Well healed scar from previous spine surgery detected small incision for right L4-L5 partial laminectomy Alignment: Symmetrical Functional ROM: Pain restricted ROM       Stability: No instability detected Muscle Tone/Strength: Functionally intact. No obvious neuro-muscular anomalies detected. Sensory (Neurological): Dermatomal pain pattern right L4-L5 Palpation: No palpable anomalies       Provocative Tests: Hyperextension/rotation test: deferred today       Lumbar quadrant test (Kemp's test): (+) on the right for foraminal stenosis Lateral bending test: (+) ipsilateral radicular pain, on the right. Positive for right-sided  foraminal stenosis.   Gait & Posture Assessment  Ambulation: Unassisted Gait: Relatively normal for age and body habitus Posture: WNL    Lower Extremity Exam      Side: Right lower extremity   Side: Left lower extremity  Stability: No instability observed           Stability: No instability observed          Skin & Extremity Inspection: Skin color, temperature, and hair growth are WNL. No peripheral edema or cyanosis. No masses,  redness, swelling, asymmetry, or associated skin lesions. No contractures.   Skin & Extremity Inspection: Skin color, temperature, and hair growth are WNL. No peripheral edema or cyanosis. No masses, redness, swelling, asymmetry, or associated skin lesions. No contractures.  Functional ROM: Unrestricted ROM                   Functional ROM: Unrestricted ROM                  Muscle Tone/Strength: Functionally intact. No obvious neuro-muscular anomalies detected.   Muscle Tone/Strength: Functionally intact. No obvious neuro-muscular anomalies detected.  Sensory (Neurological): Neurogenic pain pattern lateral aspect of foot (S1)   Sensory (Neurological): Unimpaired        DTR: Patellar: deferred today Achilles: deferred today Plantar: deferred today   DTR: Patellar: deferred today Achilles: deferred today Plantar: deferred today  Palpation: No palpable anomalies   Palpation: No palpable anomalies    Assessment   Diagnosis Status  1. Lumbar post-laminectomy syndrome (right l4/5)   2. Lumbar radicular pain   3. 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  SCS devices provide continuous electrical impulses to the spinal cord, modulating pain signals before they reach the brain.  We discussed the potential benefits and risks of spinal cord stimulation. Benefits: can provide substantial relief from chronic pain, long-term therapy with programmable  settings, often reduces the need for oral medications, including opioids, some patients experience reduced dependency on other pain interventions. Risks (including but not limited to): Surgical risks, including infection, lead migration/fracture, and hardware malfunction, long-term implantation requires ongoing maintenance and possible reoperation, variable effectiveness: some patients do not experience sufficient pain relief.   I discussed  percutaneous spinal cord stimulator trial with the patient in detail. I explained to the patient that they will have an external power source and programmer which the patient will use for 7 days. There will be daily communication with the stimulator company and the patient. A possible need for a mid-trial clinic visit to give the patient the best chance of success.   Patient will need to have a thorough psychosocial behavioral evaluation. Our office will be happy to help facilitate this. Will place referral.  He will also need a thoracic MRI prior to his SCS trial.  I was also able to evaluate his interlaminar windows under live fluoroscopy and they appear patent percutaneous access.  Some of patient's pain does seem to be mechanical  in nature, with some component of neurogenic pain as well. We discussed the indications for spinal cord stimulation, specifically stating that it is typically better for neuropathic and appendicular pain, but that we have had some success in the treatment of low back and hip pain.   Patient is interested in proceeding with spinal cord stimulation trial. He understands that this may not be successful, and that spinal cord stimulation in general is not a magic bullet.   We had a lengthy and very detailed discussion of all the risks, benefits, alternatives, and rationale of surgery as well as the option of continuing nonsurgical therapies. We specifically discussed the risks of temporary or permanent worsened neurologic injury, no  symptomatic relief or pain made worse after procedure, and also the need for future surgery (due to infection, CSF leak, bleeding, adjacent segment issues, bone-healing difficulties, and other related issues). No guarantees of outcome were made or implied and he is eager to proceed and presents for definitive treatment.  Pt told me that all of his questions were answered thoroughly and to his satisfaction. Confidence and understanding of the discussed risks and consequences of  treatment was expressed and he accepted these risks and was eager to proceed with procedure.   Issues concerning treatment and diagnosis were discussed with the patient. There are no barriers to understanding the plan of treatment. Explanation was well received by patient and/or family who then verbalized understanding.        Gary Irwin has a current medication list which includes the following long-term medication(s): trazodone.  Pharmacotherapy (Medications Ordered): No orders of the defined types were placed in this encounter.  Orders:  Orders Placed This Encounter  Procedures   [facility] TRIAL    Contact medical implant company representative to notify them of the scheduled case and to make sure they will be available to provide required equipment.    Standing Status:   Future    Expected Date:   11/27/2023    Expiration Date:   01/20/2024    Scheduling Instructions:     Side: Bilateral     Level: Lumbar     Device: Medtronic     Sedation: With sedation     Timeframe: As soon as pre-approved    Where will this procedure be performed?:   [facility]   MR THORACIC SPINE WO CONTRAST    Patient presents with axial pain with possible radicular component. Please assist us  in identifying specific level(s) and laterality of any additional findings such as: 1. Facet (Zygapophyseal) joint DJD (Hypertrophy, space narrowing, subchondral sclerosis, and/or osteophyte formation) 2. DDD and/or IVDD (Loss of disc  height, desiccation, gas patterns, osteophytes, endplate sclerosis, or Black disc disease) 3. Pars defects 4. Spondylolisthesis, spondylosis, and/or spondyloarthropathies (include Degree/Grade of displacement in mm) (stability) 5. Vertebral body Fractures (acute/chronic) (state percentage of collapse) 6. Demineralization (osteopenia/osteoporotic) 7. Bone pathology 8. Foraminal narrowing  9. Surgical changes 10. Central, Lateral Recess, and/or Foraminal Stenosis (include AP diameter of stenosis in mm) 11. Surgical changes (hardware type, status, and presence of fibrosis) 12. Modic Type Changes (MRI only) 13. IVDD (Disc bulge, protrusion, herniation, extrusion) (Level, laterality, extent)    Standing Status:   Future    Expiration Date:   11/22/2023    Scheduling Instructions:     Please make sure that the patient understands that this needs to be done as soon as possible. Never have the patient do the imaging just before the next appointment. Inform patient  that having the imaging done within the [facility] will expedite the availability of the results and will provide      imaging availability to the requesting physician. In addition inform the patient that the imaging order has an expiration date and will not be renewed if not done within the active period.    What is the patient's sedation requirement?:   No Sedation    Does the patient have a pacemaker or implanted devices?:   No    Preferred imaging location?:   [facility] (table limit-350lbs)    Call Results- Best Contact Number?:   [phone] [facility])    Radiology Contrast Protocol - do NOT remove file path:   [email or web address]   Ambulatory referral to Psychology    Referral Priority:   Routine    Referral Type:   Psychiatric    Referral Reason:   Specialty Services Required    Requested Specialty:   Psychology    Number of Visits Requested:   1   Follow-up plan:   Return in  about 5 weeks (around 11/27/2023) for Medtronic SCS trial.      Recent Visits No visits were found meeting these conditions. Showing recent visits within past 90 days and meeting all other requirements Today's Visits Date Type Provider Dept  10/22/23 Office Visit Marcelino Nurse, MD [facility]  Showing today's visits and meeting all other requirements Future Appointments No visits were found meeting these conditions. Showing future appointments within next 90 days and meeting all other requirements  I discussed the assessment and treatment plan with the patient. The patient was provided an opportunity to ask questions and all were answered. The patient agreed with the plan and demonstrated an understanding of the instructions.  Patient advised to call back or seek an in-person evaluation if the symptoms or condition worsens.  Duration of encounter:58minutes.  Total time on encounter, as per AMA guidelines included both the face-to-face and non-face-to-face time personally spent by the physician and/or other qualified health care professional(s) on the day of the encounter (includes time in activities that require the physician or other qualified health care professional and does not include time in activities normally performed by clinical staff). Physician's time may include the following activities when performed: Preparing to see the patient (e.g., pre-charting review of records, searching for previously ordered imaging, lab work, and nerve conduction tests) Review of prior analgesic pharmacotherapies. Reviewing PMP Interpreting ordered tests (e.g., lab work, imaging, nerve conduction tests) Performing post-procedure evaluations, including interpretation of diagnostic procedures Obtaining and/or reviewing separately obtained history Performing a medically appropriate examination and/or evaluation Counseling and educating the patient/family/caregiver Ordering medications,  tests, or procedures Referring and communicating with other health care professionals (when not separately reported) Documenting clinical information in the electronic or other health record Independently interpreting results (not separately reported) and communicating results to the patient/ family/caregiver Care coordination (not separately reported)  Note by: Nurse Marcelino, MD Date: 10/22/2023; Time: 3:43 PM

## 2023-10-22 NOTE — Patient Instructions (Signed)
 You have been given information about Medtronic SCS trial.  You will have MRI prior to SCS trial.

## 2023-10-22 NOTE — Progress Notes (Signed)
 Safety precautions to be maintained throughout the outpatient stay will include: orient to surroundings, keep bed in low position, maintain call bell within reach at all times, provide assistance with transfer out of bed and ambulation.

## 2023-10-29 ENCOUNTER — Ambulatory Visit
Admission: RE | Admit: 2023-10-29 | Discharge: 2023-10-29 | Disposition: A | Discharge status: Home / Self Care | Payer: No Typology Code available for payment source | Source: Ambulatory Visit | Attending: Student in an Organized Health Care Education/Training Program | Admitting: Student in an Organized Health Care Education/Training Program

## 2023-10-29 DIAGNOSIS — M5134 Other intervertebral disc degeneration, thoracic region: Secondary | ICD-10-CM | POA: Diagnosis present

## 2024-03-18 ENCOUNTER — Ambulatory Visit: Admitting: Student in an Organized Health Care Education/Training Program

## 2024-03-30 ENCOUNTER — Encounter: Payer: Self-pay | Admitting: Student in an Organized Health Care Education/Training Program

## 2024-03-30 ENCOUNTER — Ambulatory Visit (HOSPITAL_BASED_OUTPATIENT_CLINIC_OR_DEPARTMENT_OTHER): Admitting: Student in an Organized Health Care Education/Training Program

## 2024-03-30 ENCOUNTER — Ambulatory Visit
Admission: RE | Admit: 2024-03-30 | Discharge: 2024-03-30 | Disposition: A | Discharge status: Home / Self Care | Source: Ambulatory Visit | Attending: Student in an Organized Health Care Education/Training Program | Admitting: Student in an Organized Health Care Education/Training Program

## 2024-03-30 ENCOUNTER — Other Ambulatory Visit: Payer: Self-pay

## 2024-03-30 VITALS — BP 122/85 | HR 66 | Temp 97.7°F | Resp 12 | Ht 75.0 in | Wt 272.0 lb

## 2024-03-30 DIAGNOSIS — M961 Postlaminectomy syndrome, not elsewhere classified: Secondary | ICD-10-CM | POA: Insufficient documentation

## 2024-03-30 DIAGNOSIS — G8929 Other chronic pain: Secondary | ICD-10-CM | POA: Diagnosis present

## 2024-03-30 DIAGNOSIS — M5416 Radiculopathy, lumbar region: Secondary | ICD-10-CM | POA: Insufficient documentation

## 2024-03-30 MED ORDER — ROPIVACAINE HCL 2 MG/ML IJ SOLN
INTRAMUSCULAR | Status: AC
  Filled 2024-03-30: qty 20

## 2024-03-30 MED ORDER — LACTATED RINGERS IV SOLN: Freq: Once | INTRAVENOUS | Status: AC

## 2024-03-30 MED ORDER — ROPIVACAINE HCL 2 MG/ML IJ SOLN: 18.0000 mL | Freq: Once | INTRAMUSCULAR | Status: AC

## 2024-03-30 MED ORDER — FENTANYL CITRATE (PF) 100 MCG/2ML IJ SOLN
INTRAMUSCULAR | Status: AC
  Filled 2024-03-30: qty 2

## 2024-03-30 MED ORDER — CEPHALEXIN 500 MG PO CAPS: 500.0000 mg | ORAL_CAPSULE | Freq: Four times a day (QID) | ORAL | 0 refills | Status: AC

## 2024-03-30 MED ORDER — MIDAZOLAM HCL 5 MG/5ML IJ SOLN
INTRAMUSCULAR | Status: AC
  Filled 2024-03-30: qty 5

## 2024-03-30 MED ORDER — MIDAZOLAM HCL 5 MG/5ML IJ SOLN
0.5000 mg | Freq: Once | INTRAMUSCULAR | Status: AC
  Administered 2024-03-30: 2 mg via INTRAVENOUS

## 2024-03-30 MED ORDER — LIDOCAINE HCL 2 % IJ SOLN
INTRAMUSCULAR | Status: AC
  Filled 2024-03-30: qty 20

## 2024-03-30 MED ORDER — FENTANYL CITRATE (PF) 100 MCG/2ML IJ SOLN
25.0000 ug | INTRAMUSCULAR | Status: DC | PRN
  Administered 2024-03-30: 75 ug via INTRAVENOUS

## 2024-03-30 MED ORDER — CEFAZOLIN SODIUM 1 G IJ SOLR
INTRAMUSCULAR | Status: AC
  Filled 2024-03-30: qty 20

## 2024-03-30 MED ORDER — LIDOCAINE HCL 2 % IJ SOLN
20.0000 mL | Freq: Once | INTRAMUSCULAR | Status: AC
  Administered 2024-03-30: 400 mg

## 2024-03-30 NOTE — Progress Notes (Signed)
 PROVIDER NOTE: Interpretation of information contained herein should be left to medically-trained personnel. Specific patient instructions are provided elsewhere under Patient Instructions section of medical record. This document was created in part using STT-dictation technology, any transcriptional errors that may result from this process are unintentional.  Patient: Gary Irwin Type: Established DOB: [birth year] MRN: [id number] PCP: [facility]  Service: Procedure DOS: 03/30/2024 Setting: Ambulatory Location: Ambulatory outpatient facility Delivery: Face-to-face Provider: Cephus Collin, MD Specialty: Interventional Pain Management Specialty designation: 09 Location: Outpatient facility Ref. Prov.: [facility]       Interventional Therapy   Primary Reason for Admission: Surgical management of chronic pain condition.   Procedure:              Type: [employer] Trial Spinal Cord Neurostimulator Implant (Percutaneous, interlaminar, posterior epidural placement) Laterality: Bilateral (-50)  Level: Lumbar  Imaging: Fluoroscopic guidance Anesthesia: Local anesthesia (1-2% Lidocaine) Sedation: Moderate Sedation                       DOS: 03/30/2024  Performed by: Cephus Collin, MD  Purpose: Diagnostic. To determine if a permanent implant may be effective in controlling some or all of Mr. Glaab's chronic pain symptoms.  Rationale (medical necessity): procedure needed and proper for the diagnosis and/or treatment of Mr. Rawles's medical symptoms and needs. 1. Lumbar post-laminectomy syndrome (right l4/5)   2. Lumbar radicular pain   3. 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 Location: Posterior intraspinal canal Region: Thoracolumbar  Approach: Translaminar  percutaneous  Type of procedure: Surgical   Position / Prep / Materials:  Position: Prone  Prep solution: ChloraPrep (2% chlorhexidine gluconate and 70% isopropyl alcohol) Prep Area: Entire  Posterior  Thoracolumbar  Region  Materials:  Tray: Implant tray Needle(s):  Type: Epidural  Gauge (G): 14  Length: Regular (10cm)  Qty: 2  H&P (Pre-op Assessment):  Mr. Henningsen is a 33 y.o. (year old), male patient, seen today for interventional treatment. He  has a past surgical history that includes Back surgery and Shoulder surgery (Left).  Initial Vital Signs:  Pulse/EKG Rate: 66ECG Heart Rate: (!) 54 (sb) Temp: 97.7 F (36.5 C) Resp: 16 BP: 122/84 SpO2: 99 %  BMI: Estimated body mass index is 34 kg/m as calculated from the following:   Height as of this encounter: 6' 3 (1.905 m).   Weight as of this encounter: 272 lb (123.4 kg).  Risk Assessment: Allergies: Reviewed. He is allergic to latex and shellfish-derived products.  Allergy Precautions: Latex-free protocol activated. No iodine containing solutions or radiological contrast used. Coagulopathies: Reviewed. None identified.  Blood-thinner therapy: None at this time Active Infection(s): Reviewed. None identified. Mr. Rocca is afebrile  Site Confirmation: Mr. Seymour was asked to confirm the procedure and laterality before marking the site, which he did. Procedure checklist: Completed Consent: Before the procedure and under the influence of no sedative(s), amnesic(s), or anxiolytics, the patient was informed of the treatment options, risks and possible complications. To fulfill our ethical and legal obligations, as recommended by the American Medical Association's Code of Ethics, I have informed the patient of my clinical impression; the nature and purpose of the treatment or procedure; the risks, benefits, and possible complications of the intervention; the alternatives, including doing nothing; the risk(s) and benefit(s)  of the  alternative treatment(s) or procedure(s); and the risk(s) and benefit(s) of doing nothing.  Mr. Barstow was provided with information about the general risks and possible complications associated with most interventional procedures. These include, but are not limited to: failure to achieve desired goals, infection, bleeding, organ or nerve damage, allergic reactions, paralysis, and/or death.  In addition, he was informed of those risks and possible complications associated to this particular procedure, which include, but are not limited to: damage to the implant; failure to decrease pain; local, systemic, or serious CNS infections, intraspinal abscess with possible cord compression and paralysis, or life-threatening such as meningitis; intrathecal and/or epidural bleeding with formation of hematoma with possible spinal cord compression and permanent paralysis; organ damage; nerve injury or damage with subsequent sensory, motor, and/or autonomic system dysfunction, resulting in transient or permanent pain, numbness, and/or weakness of one or several areas of the body; allergic reactions, either minor or major life-threatening, such as anaphylactic or anaphylactoid reactions.  Furthermore, Mr. Gerst was informed of those risks and complications associated with the medications. These include, but are not limited to: allergic reactions (i.e.: anaphylactic or anaphylactoid reactions); arrhythmia;  Hypotension/hypertension; cardiovascular collapse; respiratory depression and/or shortness of breath; swelling or edema; medication-induced neural toxicity; particulate matter embolism and blood vessel occlusion with resultant organ, and/or nervous system infarction and permanent paralysis.  Finally, he was informed that Medicine is not an exact science; therefore, there is also the possibility of unforeseen or unpredictable risks and/or possible complications that may result in a catastrophic outcome. The patient  indicated having understood very clearly. We have given the patient no guarantees and we have made no promises. Enough time was given to the patient to ask questions, all of which were answered to the patient's satisfaction. Mr. Creson has indicated that he wanted to continue with the procedure. Attestation: I, the ordering provider, attest that I have discussed with the patient the benefits, risks, side-effects, alternatives, likelihood of achieving goals, and potential problems during recovery for the procedure that I have provided informed consent. Date  Time: 03/30/2024  7:48 AM  Pre-Procedure Preparation:  Monitoring: As per clinic protocol. Respiration, ETCO2, SpO2, BP, heart rate and rhythm monitor placed and checked for adequate function Safety Precautions: Patient was assessed for positional comfort and pressure points before starting the procedure. Time-out: I initiated and conducted the Time-out before starting the procedure, as per protocol. The patient was asked to participate by confirming the accuracy of the Time Out information. Verification of the correct person, site, and procedure were performed and confirmed by me, the nursing staff, and the patient. Time-out conducted as per Joint Commission's Universal Protocol (UP.01.01.01). Time: 0853 Start Time: 0853 hrs.  Description/Narrative of Procedure:          Rationale (medical necessity): procedure needed and proper for the diagnosis and/or treatment of the patient's medical symptoms and needs. Procedural Technique Safety Precautions: Aspiration looking for blood return was conducted prior to all injections. At no point did we inject any substances, as a needle was being advanced. No attempts were made at seeking any paresthesias. Safe injection practices and needle disposal techniques used. Medications properly checked for expiration dates. SDV (single dose vial) medications used. Description of the Procedure: Protocol  guidelines were followed. The patient was assisted into a comfortable position. The target area was identified and the area prepped in the usual manner. Skin & deeper tissues infiltrated with local anesthetic. Appropriate amount of time allowed to pass for local anesthetics to  take effect. The procedure needles were then advanced to the target area. Proper needle placement secured. Negative aspiration confirmed. Solution injected in intermittent fashion, asking for systemic symptoms every 0.5cc of injectate. The needles were then removed and the area cleansed, making sure to leave some of the prepping solution back to take advantage of its long term bactericidal properties.  Technical description of procedure: Availability of a responsible, adult driver, and NPO status confirmed. Informed consent was obtained after having discussed risks and possible complications. An IV was started. The patient was then taken to the fluoroscopy suite, where the patient was placed in position for the procedure, over the fluoroscopy table. The patient was then monitored in the usual manner. Fluoroscopy was manipulated to obtain the best possible view of the target. Parallex error was corrected before commencing the procedure. Once a clear view of the target had been obtained, the skin and deeper tissues over the procedure site were infiltrated using lidocaine, loaded in a 10 cc luer-loc syringe with a 0.5 inch, 25-G needle. The introducer needle(s) was/were then inserted through the skin and deeper tissues. A paramidline approach was used to enter the posterior epidural space at a 30 angle, using "Loss-of-resistance Technique" with 3 ml of PF-NaCl (0.9% NSS). Correct needle placement was confirmed in the antero-posterior and lateral fluoroscopic views. The lead was gently introduced and manipulated under real-time fluoroscopy, constantly assessing for pain, discomfort, or paresthesias, until the tip rested at the desired level.  Both sides were done in identical fashion. Electrode placement was tested until appropriate coverage was attained. Once the patient confirmed that the stimulation was over the desired area, the lead(s) was/were secured in place and the introducer needles removed. This was done under real-time fluoroscopy while observing the electrode tip to avoid unintended migration. The area was covered with a non-occlusive dressing and the patient transported to recovery for further programming.  Vitals:   03/30/24 0924 03/30/24 0934 03/30/24 0944 03/30/24 0954  BP: (!) 125/90 125/74 118/77 122/85  Pulse:      Resp: 17 10 12 12   Temp:      SpO2: 100% 99% 99% 100%  Weight:      Height:        Start Time: 0853 hrs. End Time: 0924 hrs.  Neurostimulator Details:  Lead(s):  Brand: Medtronic         Epidural Access Level:  T12-L1 T12-L1  Lead implant:  Bilateral   No. of Electrodes/Lead:  8 8  Laterality:  Left Right  Top electrode location:  T7-8 T7-8  Model No.: [id number] Same  Length: 60cm Same  Lot No.: [id number]  MRI compatibility:  Conditional Same   Imaging Guidance (Spinal):         Type of Imaging Technique: Fluoroscopy Guidance (Spinal) Indication(s): Fluoroscopy guidance for needle placement to enhance accuracy in procedures requiring precise needle localization for targeted delivery of medication in or near specific anatomical locations not easily accessible without such real-time imaging assistance. Exposure Time: Please see nurses notes. Contrast: None used. Fluoroscopic Guidance: I was personally present during the use of fluoroscopy. Tunnel Vision Technique used to obtain the best possible view of the target area. Parallax error corrected before commencing the procedure. Direction-depth-direction technique used to introduce the needle under continuous pulsed fluoroscopy. Once target was reached, antero-posterior, oblique, and lateral fluoroscopic projection used confirm  needle placement in all planes. Images permanently stored in EMR. Interpretation: No contrast injected. I personally interpreted the imaging intraoperatively. Adequate needle placement confirmed  in multiple planes. Permanent images saved into the patient's record.    Antibiotic Prophylaxis:  2g ANCEF PREOP Anti-infectives (From admission, onward)    Start     Dose/Rate Route Frequency Ordered Stop   03/30/24 0000  cephALEXin (KEFLEX) 500 MG capsule        500 mg Oral 4 times daily 03/30/24 0826 04/06/24 2359      Indication(s): Implant Prophylaxis.  Post-operative Assessment:  Post-procedure Vital Signs:  Pulse/HCG Rate: 66(!) 50 Temp: 97.7 F (36.5 C) Resp: 12 BP: 122/85 SpO2: 100 %  Complications: No immediate post-treatment complications observed by team, or reported by patient.  Note: The patient tolerated the entire procedure well. A repeat set of vitals were taken after the procedure and the patient was kept under observation following institutional policy, for this type of procedure. Post-procedural neurological assessment was performed, showing return to baseline, prior to discharge. The patient was provided with post-procedure discharge instructions, including a section on how to identify potential problems. Should any problems arise concerning this procedure, the patient was given instructions to immediately contact us , at any time, without hesitation. In any case, we plan to contact the patient by telephone for a follow-up status report regarding this interventional procedure.  Comments:  No additional relevant information.  Plan of Care Orders:  Orders Placed This Encounter  Procedures   DG PAIN CL[facility] C-ARM 1-60 MIN NO REPORT    Intraoperative interpretation by procedural physician at [facility].    Standing Status:   Standing    Number of Occurrences:   1    Reason for exam::   Assistance in needle guidance and placement for procedures requiring  needle placement in or near specific anatomical locations not easily accessible without such assistance.   Medications administered: We administered lidocaine, lactated ringers, midazolam, fentaNYL, and ropivacaine (PF) 2 mg/mL (0.2%).  See the medical record for exact dosing, route, and time of administration.  Follow-up plan:   Return in about 1 week (around 04/06/2024) for SCS lead pull.     Recent Visits No visits were found meeting these conditions. Showing recent visits within past 90 days and meeting all other requirements Today's Visits Date Type Provider Dept  03/30/24 Procedure visit Cephus Collin, MD [facility]  Showing today's visits and meeting all other requirements Future Appointments Date Type Provider Dept  04/06/24 Appointment Cephus Collin, MD [facility]  Showing future appointments within next 90 days and meeting all other requirements  Disposition: Discharge home  Discharge (Date  Time): 03/30/2024; 1000 hrs.   Primary Care Physician: [facility] Location: [facility] Note by: Cephus Collin, MD (TTS technology used. I apologize for any typographical errors that were not detected and corrected.) Date: 03/30/2024; Time: 9:59 AM

## 2024-03-30 NOTE — Patient Instructions (Addendum)
 Moderate Conscious Sedation, Adult, Care After After the procedure, it is common to have: Sleepiness for a few hours. Impaired judgment for a few hours. Trouble with balance. Nausea or vomiting if you eat too soon. Follow these instructions at home: For the time period you were told by your health care provider:  Rest. Do not participate in activities where you could fall or become injured. Do not drive or use machinery. Do not drink alcohol. Do not take sleeping pills or medicines that cause drowsiness. Do not make important decisions or sign legal documents. Do not take care of children on your own. Eating and drinking Follow instructions from your health care provider about what you may eat and drink. Drink enough fluid to keep your urine pale yellow. If you vomit: Drink clear fluids slowly and in small amounts as you are able. Clear fluids include water, ice chips, low-calorie sports drinks, and fruit juice that has water added to it (diluted fruit juice). Eat light and bland foods in small amounts as you are able. These foods include bananas, applesauce, rice, lean meats, toast, and crackers. General instructions Take over-the-counter and prescription medicines only as told by your health care provider. Have a responsible adult stay with you for the time you are told. Do not use any products that contain nicotine or tobacco. These products include cigarettes, chewing tobacco, and vaping devices, such as e-cigarettes. If you need help quitting, ask your health care provider. Return to your normal activities as told by your health care provider. Ask your health care provider what activities are safe for you. Your health care provider may give you more instructions. Make sure you know what you can and cannot do. Contact a health care provider if: You are still sleepy or having trouble with balance after 24 hours. You feel light-headed. You vomit every time you eat or drink. You get  a rash. You have a fever. You have redness or swelling around the IV site. Get help right away if: You have trouble breathing. You start to feel confused at home. These symptoms may be an emergency. Get help right away. Call 911. Do not wait to see if the symptoms will go away. Do not drive yourself to the hospital. This information is not intended to replace advice given to you by your health care provider. Make sure you discuss any questions you have with your health care provider. Document Revised: 04/16/2022 Document Reviewed: 04/16/2022 Elsevier Patient Education  2024 [employer].Today we did the following -We have done a Spinal Cord Stimulator Trial with Medtronic  -As long as the leads are in place, do not bathe or shower. You may sponge bathe.  -While the lead is in place, please limit the bending, lifting, or twisting because the lead can move.  -The things we want to see is if your pain improves (and by what percentage), if you can do more activity (don't overdo it), and if you can use less of your as needed medicine. Do not stop long acting medicines like methadone, oxycontin , MS Contin, etc without checking with us .  -It is VERY important that you pick up the antibiotics we prescribed, Keflex, on your way home from the trial and take them as prescribed(4 times a day), starting today, for as long as the lead is in place.  -The Spina Cord Stimulator Representative will be in contact with you while the lead is in place to make sure the trial goes as well as possible.  -Please  contact us  with any questions or concerns at any time during the trial.   -If you start running a fever over 100 degrees, have severe back pain, or new pain running down the legs, or drainage coming from the lead site, contact us  immediately and/or go to the emergency room.  -Please do not restart any sort of medication that can thin your blood such as Aspirin, ibuprofen , motrin , aleve, plavix,  coumadin, etc. If you aren't sure, call and ask.  -We will have you return next Monday to have the lead removed. If this is successful, at that point we can go over the details about the permanent implant.

## 2024-03-30 NOTE — Progress Notes (Signed)
 Safety precautions to be maintained throughout the outpatient stay will include: orient to surroundings, keep bed in low position, maintain call bell within reach at all times, provide assistance with transfer out of bed and ambulation.

## 2024-03-31 ENCOUNTER — Telehealth: Payer: Self-pay

## 2024-03-31 NOTE — Telephone Encounter (Signed)
 No issues post-procedure.

## 2024-04-02 ENCOUNTER — Ambulatory Visit
Admission: RE | Admit: 2024-04-02 | Discharge: 2024-04-02 | Disposition: A | Discharge status: Home / Self Care | Source: Ambulatory Visit | Attending: Student in an Organized Health Care Education/Training Program | Admitting: Student in an Organized Health Care Education/Training Program

## 2024-04-02 ENCOUNTER — Encounter: Payer: Self-pay | Admitting: Student in an Organized Health Care Education/Training Program

## 2024-04-02 ENCOUNTER — Ambulatory Visit: Admitting: Student in an Organized Health Care Education/Training Program

## 2024-04-02 VITALS — BP 133/90 | HR 80 | Resp 16 | Ht 75.0 in | Wt 272.0 lb

## 2024-04-02 DIAGNOSIS — M961 Postlaminectomy syndrome, not elsewhere classified: Secondary | ICD-10-CM

## 2024-04-02 DIAGNOSIS — M5416 Radiculopathy, lumbar region: Secondary | ICD-10-CM

## 2024-04-02 NOTE — Progress Notes (Signed)
 Safety precautions to be maintained throughout the outpatient stay will include: orient to surroundings, keep bed in low position, maintain call bell within reach at all times, provide assistance with transfer out of bed and ambulation.

## 2024-04-02 NOTE — Progress Notes (Signed)
 PROVIDER NOTE: Interpretation of information contained herein should be left to medically-trained personnel. Specific patient instructions are provided elsewhere under Patient Instructions section of medical record. This document was created in part using AI and STT-dictation technology, any transcriptional errors that may result from this process are unintentional.  Patient: Gary Irwin  Service: E/M   PCP: [facility]  DOB: 20-Jun-1991  DOS: 04/02/2024  Provider: Cephus Collin, MD  MRN: [id number]  Delivery: Face-to-face  Specialty: Interventional Pain Management  Type: Established Patient  Setting: Ambulatory outpatient facility  Specialty designation: 09  Referring Prov.: [facility]  Location: Outpatient office facility       History of present illness (HPI) Mr. Gary Irwin, a 33 y.o. year old male, is here today because of his Lumbar post-laminectomy syndrome [M96.1]. Mr. Gary Irwin primary complain today is Back Pain (Lumbar right is worse )   Vitals:  height is 6' 3 (1.905 m) and weight is 272 lb (123.4 kg). His blood pressure is 133/90 (abnormal) and his pulse is 80. His respiration is 16 and oxygen saturation is 100%.  BMI: Estimated body mass index is 34 kg/m as calculated from the following:   Height as of this encounter: 6' 3 (1.905 m).   Weight as of this encounter: 272 lb (123.4 kg).  Last encounter: 10/22/2023. Last procedure: 03/30/2024.  Reason for encounter:  Patient presents today for removal of his spinal cord stimulator trial leads.  He notes 50% improvement in his back and right leg pain during his SCS trial and also improvement in his ability to perform ADLs.  He is interested in moving forward with permanent implant.  He was having irritation with the adhesive which is why we are removing the trial leads earlier than scheduled.  He is pleased with the analgesic benefit that he experienced and would like to move forward with surgical  implant which I will place a referral for.  ROS  Constitutional: Denies any fever or chills Gastrointestinal: No reported hemesis, hematochezia, vomiting, or acute GI distress Musculoskeletal: Denies any acute onset joint swelling, redness, loss of ROM, or weakness Neurological: No reported episodes of acute onset apraxia, aphasia, dysarthria, agnosia, amnesia, paralysis, loss of coordination, or loss of consciousness  Medication Review  B-12, cephALEXin, multivitamin, traZODone, and valACYclovir   History Review  Allergy: Mr. Gary Irwin is allergic to latex and shellfish-derived products. Drug: Gary Irwin  reports no history of drug use. Alcohol:  reports current alcohol use. Tobacco:  reports that he has quit smoking. His smoking use included cigars. He has never used smokeless tobacco. Social: Gary Irwin  reports that he has quit smoking. His smoking use included cigars. He has never used smokeless tobacco. He reports current alcohol use. He reports that he does not use drugs. Medical:  has a past medical history of Arthritis, Asthma, Depression, Frequent headaches, Herpes simplex type 2 infection, and Sleep apnea. Surgical: Gary Irwin  has a past surgical history that includes Back surgery and Shoulder surgery (Left). Family: family history includes Hypertension in his father and mother.  Laboratory Chemistry Profile   Renal Lab Results  Component Value Date   BUN 10 12/04/2011   CREATININE 1.00 12/04/2011   GFRAA >90 12/04/2011   GFRNONAA >90 12/04/2011    Hepatic No results found for: AST, ALT, ALBUMIN, ALKPHOS, HCVAB, AMYLASE, LIPASE, AMMONIA  Electrolytes Lab Results  Component Value Date   NA 143 12/04/2011   K 3.7 12/04/2011   CL 103 12/04/2011   CALCIUM 10.2 12/04/2011  Bone No results found for: VD25OH, [id number], [id number], GM0102VO5, 25OHVITD1, 25OHVITD2, 25OHVITD3, TESTOFREE, TESTOSTERONE  Inflammation (CRP: Acute Phase) (ESR:  Chronic Phase) No results found for: CRP, ESRSEDRATE, LATICACIDVEN       Note: Above Lab results reviewed.    Physical Exam  General appearance: Well nourished, well developed, and well hydrated. In no apparent acute distress Mental status: Alert, oriented x 3 (person, place, & time)       Respiratory: No evidence of acute respiratory distress Eyes: PERLA Vitals: BP (!) 133/90 (BP Location: Left Arm, Patient Position: Sitting, Cuff Size: Large)   Pulse 80   Resp 16   Ht 6' 3 (1.905 m)   Wt 272 lb (123.4 kg)   SpO2 100%   BMI 34.00 kg/m  BMI: Estimated body mass index is 34 kg/m as calculated from the following:   Height as of this encounter: 6' 3 (1.905 m).   Weight as of this encounter: 272 lb (123.4 kg). Ideal: Ideal body weight: 84.5 kg (186 lb 4.6 oz) Adjusted ideal body weight: 100.1 kg (220 lb 9.2 oz)  Spinal cord stimulator trial leads removed under live fluoroscopy, tips intact  Assessment   Diagnosis  1. Lumbar post-laminectomy syndrome (right l4/5)   2. Lumbar radicular pain      Updated Problems: No problems updated.  Plan of Care  At least 50% pain relief and improvement in ADLs during duration of SCS trial.  Patient would like to proceed with permanent implant.  Referral placed to Dr. Felipe Horton with neurosurgery  Orders:  Orders Placed This Encounter  Procedures   DG PAIN CLINIC C-ARM 1-60 MIN NO REPORT    Intraoperative interpretation by procedural physician at [facility].    Standing Status:   Standing    Number of Occurrences:   1    Reason for exam::   Assistance in needle guidance and placement for procedures requiring needle placement in or near specific anatomical locations not easily accessible without such assistance.   Ambulatory referral to Neurosurgery    Referral Priority:   Routine    Referral Type:   Surgical    Referral Reason:   Specialty Services Required    Referred to Provider:   Carroll Clamp, MD    Requested  Specialty:   Neurosurgery    Number of Visits Requested:   1    Return for patient will call to schedule F2F appt prn.    Recent Visits Date Type Provider Dept  03/30/24 Procedure visit Cephus Collin, MD [facility]  Showing recent visits within past 90 days and meeting all other requirements Today's Visits Date Type Provider Dept  04/02/24 Procedure visit Cephus Collin, MD [facility]  Showing today's visits and meeting all other requirements Future Appointments Date Type Provider Dept  04/06/24 Appointment Cephus Collin, MD [facility]  Showing future appointments within next 90 days and meeting all other requirements  I discussed the assessment and treatment plan with the patient. The patient was provided an opportunity to ask questions and all were answered. The patient agreed with the plan and demonstrated an understanding of the instructions.  Patient advised to call back or seek an in-person evaluation if the symptoms or condition worsens.  Duration of encounter: .  Total time on encounter, as per AMA guidelines included both the face-to-face and non-face-to-face time personally spent by the physician and/or other qualified health care professional(s) on the day of the encounter (includes time in activities that  require the physician or other qualified health care professional and does not include time in activities normally performed by clinical staff). Physician's time may include the following activities when performed: Preparing to see the patient (e.g., pre-charting review of records, searching for previously ordered imaging, lab work, and nerve conduction tests) Review of prior analgesic pharmacotherapies. Reviewing PMP Interpreting ordered tests (e.g., lab work, imaging, nerve conduction tests) Performing post-procedure evaluations, including interpretation of diagnostic procedures Obtaining and/or reviewing separately obtained  history Performing a medically appropriate examination and/or evaluation Counseling and educating the patient/family/caregiver Ordering medications, tests, or procedures Referring and communicating with other health care professionals (when not separately reported) Documenting clinical information in the electronic or other health record Independently interpreting results (not separately reported) and communicating results to the patient/ family/caregiver Care coordination (not separately reported)  Note by: Cephus Collin, MD (TTS and AI technology used. I apologize for any typographical errors that were not detected and corrected.) Date: 04/02/2024; Time: 2:43 PM

## 2024-04-06 ENCOUNTER — Ambulatory Visit: Admitting: Student in an Organized Health Care Education/Training Program

## 2024-04-07 NOTE — H&P (View-Only) (Signed)
 Referring Physician:  Marcelino Nurse, MD [address] [place],  [place] [address]  Primary Physician:  [facility]  History of Present Illness: 04/08/2024 Gary Irwin is here today with a chief complaint of back and lower extremity pain.  He has been thoroughly evaluated by Dr. Marcelino and has had a spinal cord stimulator trial with a greater than 50% improvement in his pain and a greater improvement in his level of disability.  He states that his tingling completely went away with the stimulation with 100% relief.  He had a 3-day trial.  He did have some irritation to the adhesive.  He has previously had his psych clearance for the spinal cord stimulator.  He is also had physical therapy.  His pain continues to cause significant impact on his quality of life.  The symptoms are causing a significant impact on the patient's life.   I have utilized the care everywhere function in epic to review the outside records available from external health systems.  Review of Systems:  A 10 point review of systems is negative, except for the pertinent positives and negatives detailed in the HPI.  Past Medical History: Past Medical History:  Diagnosis Date   Arthritis    Asthma    Depression    Frequent headaches    Herpes simplex type 2 infection    Sleep apnea     Past Surgical History: Past Surgical History:  Procedure Laterality Date   BACK SURGERY     SHOULDER SURGERY Left     Allergies: Allergies as of 04/08/2024 - Review Complete 04/08/2024  Allergen Reaction Noted   Gramineae pollens Itching 06/05/2019   Latex Hives 03/18/2015   Shellfish-derived products  12/06/2017   Molds & smuts  05/07/2019   Cat dander Itching 06/05/2019    Medications:  Current Outpatient Medications:    Cyanocobalamin (B-12) 2500 MCG SUBL, Place under the tongue., Disp: , Rfl:    Multiple Vitamin (MULTIVITAMIN) capsule, Take 1 capsule by mouth daily., Disp: , Rfl:     traZODone (DESYREL) 100 MG tablet, Take by mouth., Disp: , Rfl:    valACYclovir  (VALTREX ) 1000 MG tablet, Take 1 tablet (1,000 mg total) by mouth daily., Disp: 100 tablet, Rfl: 4  Social History: Social History   Tobacco Use   Smoking status: Former    Types: Cigars   Smokeless tobacco: Never  Vaping Use   Vaping status: Every Day  Substance Use Topics   Alcohol use: Yes    Comment: occ   Drug use: No    Family Medical History: Family History  Problem Relation Age of Onset   Hypertension Mother    Hypertension Father     Physical Examination: Vitals:   04/08/24 1000  BP: 124/80    General: Patient is in no apparent distress. Attention to examination is appropriate.  Neck:   Supple.  Full range of motion.  Respiratory: Patient is breathing without any difficulty.   NEUROLOGICAL:     Awake, alert, oriented to person, place, and time.  Speech is clear and fluent.   Cranial Nerves: Pupils equal round and reactive to light.  Facial tone is symmetric.  Facial sensation is symmetric. Shoulder shrug is symmetric. Tongue protrusion is midline.    Strength:  Side Iliopsoas Quads Hamstring PF DF EHL  R 5 5 5 5 5 5   L 5 5 5 5 5 5    Reflexes are 2+ and symmetric at the patella and achilles.  Imaging: Narrative & Impression  CLINICAL DATA:  Chronic mid and low back pain radiating down the right leg for the past 2 years. Spinal cord stimulator planning.   EXAM: MRI THORACIC SPINE WITHOUT CONTRAST   TECHNIQUE: Multiplanar, multisequence MR imaging of the thoracic spine was performed. No intravenous contrast was administered.   COMPARISON:  None Available.   FINDINGS: Alignment:  Physiologic.   Vertebrae: No fracture, evidence of discitis, or bone lesion.   Cord:  Normal signal and morphology.   Paraspinal and other soft tissues: Negative.   Disc levels:   T1-T2: Prominent left subarticular disc protrusion contacting and displacing the spinal cord to the  right. Mild spinal canal stenosis. No neuroforaminal stenosis.   T2-T3 to T7-T8: No significant disc bulge or herniation. No stenosis.   T8-T9: Small right subarticular disc protrusion.  No stenosis.   T9-T10 to T11-T12: No significant disc bulge or herniation. No stenosis.   IMPRESSION: 1. Prominent left subarticular disc protrusion at T1-T2 contacting and displacing the spinal cord to the right, with associated mild spinal canal stenosis. Otherwise, no significant degenerative disc disease elsewhere in the thoracic spine.     Electronically Signed   By: Gary Irwin Shoulder M.D.   On: 11/06/2023 16:00    I have personally reviewed the images and agree with the above interpretation.  Appears to have good room for spinal cord stimulator placement, does not appear to have structural lesions that would interfere with placement.  Outside record review Neurostimulator Details:  Lead(s):  Brand: Medtronic         Epidural Access Level:  T12-L1 T12-L1  Lead implant:  Bilateral    No. of Electrodes/Lead:  8 8  Laterality:  Left Right  Top electrode location:  T7-8 T7-8  Model No.: [id number] Same  Length: 60cm Same  Lot No.: [id number]  MRI compatibility:  Conditional Same     Medical Decision Making/Assessment and Plan: Mr. Gary Irwin is a pleasant 33 y.o. male with history of a laminectomy and chronic pain syndrome.  His chronic radicular pain as well as chronic back pain mostly on the right side, radiates down his feet.  He also has numbness and tingling and burning in his feet.  He has been undergoing care by our pain team, has had his psych evaluation, has had a previous spinal cord stimulator trial with an overall greater than 50% relief in his symptoms, 100% relief in his lower extremity radiating issues, and the improvement in his activities of daily living and his ability to move.  Overall he was very pleased with his outcome.  He would like to go forward with a spinal cord  stimulator permanent placement.  We discussed risks and benefit including but not limited to nerve injury CSF leak, failure to improve his symptoms, incomplete control of his symptoms, migration of the leads, breakage of the leads, as well as an outcome not perfectly reflective of his trial.  He would like to go forward with the procedure.  We did discuss that he had to have to take at least 2 to 3 months off of bowling, his league does start in September and I let him know that he would likely need to take time off to recover.  Thank you for involving me in the care of this patient.    Penne MICAEL Sharps MD/MSCR Neurosurgery

## 2024-04-07 NOTE — Progress Notes (Unsigned)
 Referring Physician:  Marcelino Nurse, MD [address] [place],  [place] [address]  Primary Physician:  [facility]  History of Present Illness: 04/08/2024 Gary Irwin is here today with a chief complaint of back and lower extremity pain.  He has been thoroughly evaluated by Dr. Marcelino and has had a spinal cord stimulator trial with a greater than 50% improvement in his pain and a greater improvement in his level of disability.  He states that his tingling completely went away with the stimulation with 100% relief.  He had a 3-day trial.  He did have some irritation to the adhesive.  He has previously had his psych clearance for the spinal cord stimulator.  He is also had physical therapy.  His pain continues to cause significant impact on his quality of life.  The symptoms are causing a significant impact on the patient's life.   I have utilized the care everywhere function in epic to review the outside records available from external health systems.  Review of Systems:  A 10 point review of systems is negative, except for the pertinent positives and negatives detailed in the HPI.  Past Medical History: Past Medical History:  Diagnosis Date   Arthritis    Asthma    Depression    Frequent headaches    Herpes simplex type 2 infection    Sleep apnea     Past Surgical History: Past Surgical History:  Procedure Laterality Date   BACK SURGERY     SHOULDER SURGERY Left     Allergies: Allergies as of 04/08/2024 - Review Complete 04/08/2024  Allergen Reaction Noted   Gramineae pollens Itching 06/05/2019   Latex Hives 03/18/2015   Shellfish-derived products  12/06/2017   Molds & smuts  05/07/2019   Cat dander Itching 06/05/2019    Medications:  Current Outpatient Medications:    Cyanocobalamin (B-12) 2500 MCG SUBL, Place under the tongue., Disp: , Rfl:    Multiple Vitamin (MULTIVITAMIN) capsule, Take 1 capsule by mouth daily., Disp: , Rfl:     traZODone (DESYREL) 100 MG tablet, Take by mouth., Disp: , Rfl:    valACYclovir  (VALTREX ) 1000 MG tablet, Take 1 tablet (1,000 mg total) by mouth daily., Disp: 100 tablet, Rfl: 4  Social History: Social History   Tobacco Use   Smoking status: Former    Types: Cigars   Smokeless tobacco: Never  Vaping Use   Vaping status: Every Day  Substance Use Topics   Alcohol use: Yes    Comment: occ   Drug use: No    Family Medical History: Family History  Problem Relation Age of Onset   Hypertension Mother    Hypertension Father     Physical Examination: Vitals:   04/08/24 1000  BP: 124/80    General: Patient is in no apparent distress. Attention to examination is appropriate.  Neck:   Supple.  Full range of motion.  Respiratory: Patient is breathing without any difficulty.   NEUROLOGICAL:     Awake, alert, oriented to person, place, and time.  Speech is clear and fluent.   Cranial Nerves: Pupils equal round and reactive to light.  Facial tone is symmetric.  Facial sensation is symmetric. Shoulder shrug is symmetric. Tongue protrusion is midline.    Strength:  Side Iliopsoas Quads Hamstring PF DF EHL  R 5 5 5 5 5 5   L 5 5 5 5 5 5    Reflexes are 2+ and symmetric at the patella and achilles.  Imaging: Narrative & Impression  CLINICAL DATA:  Chronic mid and low back pain radiating down the right leg for the past 2 years. Spinal cord stimulator planning.   EXAM: MRI THORACIC SPINE WITHOUT CONTRAST   TECHNIQUE: Multiplanar, multisequence MR imaging of the thoracic spine was performed. No intravenous contrast was administered.   COMPARISON:  None Available.   FINDINGS: Alignment:  Physiologic.   Vertebrae: No fracture, evidence of discitis, or bone lesion.   Cord:  Normal signal and morphology.   Paraspinal and other soft tissues: Negative.   Disc levels:   T1-T2: Prominent left subarticular disc protrusion contacting and displacing the spinal cord to the  right. Mild spinal canal stenosis. No neuroforaminal stenosis.   T2-T3 to T7-T8: No significant disc bulge or herniation. No stenosis.   T8-T9: Small right subarticular disc protrusion.  No stenosis.   T9-T10 to T11-T12: No significant disc bulge or herniation. No stenosis.   IMPRESSION: 1. Prominent left subarticular disc protrusion at T1-T2 contacting and displacing the spinal cord to the right, with associated mild spinal canal stenosis. Otherwise, no significant degenerative disc disease elsewhere in the thoracic spine.     Electronically Signed   By: Elsie ONEIDA Shoulder M.D.   On: 11/06/2023 16:00    I have personally reviewed the images and agree with the above interpretation.  Appears to have good room for spinal cord stimulator placement, does not appear to have structural lesions that would interfere with placement.  Outside record review Neurostimulator Details:  Lead(s):  Brand: Medtronic         Epidural Access Level:  T12-L1 T12-L1  Lead implant:  Bilateral    No. of Electrodes/Lead:  8 8  Laterality:  Left Right  Top electrode location:  T7-8 T7-8  Model No.: [id number] Same  Length: 60cm Same  Lot No.: [id number]  MRI compatibility:  Conditional Same     Medical Decision Making/Assessment and Plan: Gary Irwin is a pleasant 33 y.o. male with history of a laminectomy and chronic pain syndrome.  His chronic radicular pain as well as chronic back pain mostly on the right side, radiates down his feet.  He also has numbness and tingling and burning in his feet.  He has been undergoing care by our pain team, has had his psych evaluation, has had a previous spinal cord stimulator trial with an overall greater than 50% relief in his symptoms, 100% relief in his lower extremity radiating issues, and the improvement in his activities of daily living and his ability to move.  Overall he was very pleased with his outcome.  He would like to go forward with a spinal cord  stimulator permanent placement.  We discussed risks and benefit including but not limited to nerve injury CSF leak, failure to improve his symptoms, incomplete control of his symptoms, migration of the leads, breakage of the leads, as well as an outcome not perfectly reflective of his trial.  He would like to go forward with the procedure.  We did discuss that he had to have to take at least 2 to 3 months off of bowling, his league does start in September and I let him know that he would likely need to take time off to recover.  Thank you for involving me in the care of this patient.    Penne MICAEL Sharps MD/MSCR Neurosurgery

## 2024-04-08 ENCOUNTER — Ambulatory Visit (INDEPENDENT_AMBULATORY_CARE_PROVIDER_SITE_OTHER): Admitting: Neurosurgery

## 2024-04-08 ENCOUNTER — Encounter: Payer: Self-pay | Admitting: Neurosurgery

## 2024-04-08 ENCOUNTER — Other Ambulatory Visit: Payer: Self-pay

## 2024-04-08 VITALS — BP 124/80 | Ht 75.0 in | Wt 274.0 lb

## 2024-04-08 DIAGNOSIS — M5441 Lumbago with sciatica, right side: Secondary | ICD-10-CM

## 2024-04-08 DIAGNOSIS — Z01818 Encounter for other preprocedural examination: Secondary | ICD-10-CM

## 2024-04-08 DIAGNOSIS — M5416 Radiculopathy, lumbar region: Secondary | ICD-10-CM

## 2024-04-08 DIAGNOSIS — G8929 Other chronic pain: Secondary | ICD-10-CM | POA: Diagnosis not present

## 2024-04-08 DIAGNOSIS — M961 Postlaminectomy syndrome, not elsewhere classified: Secondary | ICD-10-CM | POA: Diagnosis not present

## 2024-04-08 NOTE — Addendum Note (Signed)
 Addended by: Marchella Hibbard on: 04/08/2024 10:52 AM   Modules accepted: Orders

## 2024-04-08 NOTE — Patient Instructions (Signed)
 Please see below for information in regards to your upcoming surgery:   Planned surgery: thoracic laminectomy for spinal cord stimulator placement and internal pulse generator placement ([employer]tr[employer])   Surgery date: 04/30/24 at [facility] (Medical Mall: [address] [place], [place] [address]) - you will find out your arrival time the business day before your surgery.   Pre-op appointment at [facility] Pre-admit Testing: you will receive a call with a date/time for this appointment. If you are scheduled for an in person appointment, Pre-admit Testing is located on the first floor of the Medical Arts building, [address], [address]. During this appointment, they will advise you which medications you can take the morning of surgery, and which medications you will need to hold for surgery. Labs (such as blood work, EKG) may be done at your pre-op appointment. You are not required to fast for these labs. Should you need to change your pre-op appointment, please call Pre-admit testing at [phone].     Common restrictions after surgery: No bending, lifting, or twisting ("BLT"). Avoid lifting objects heavier than 10 pounds for the first 6 weeks after surgery. Where possible, avoid household activities that involve lifting, bending, reaching, pushing, or pulling such as laundry, vacuuming, grocery shopping, and childcare. Try to arrange for help from friends and family for these activities while you heal. Do not drive while taking prescription pain medication. Weeks 6 through 12 after surgery: avoid lifting more than 25 pounds.    How to contact us :  If you have any questions/concerns before or after surgery, you can reach us  at [phone], or you can send a mychart message. We can be reached by phone or mychart 8am-4pm, Monday-Friday.  *Please note: Calls after 4pm are forwarded to a third party answering service. Mychart messages are not routinely  monitored during evenings, weekends, and holidays. Please call our office to contact the answering service for urgent concerns during non-business hours.    If you have FMLA/disability paperwork, please drop it off or fax it to [phone], attention Patty.   Appointments/FMLA & disability paperwork: Odetta Mora, & Ritta Registered Nurses/Surgery schedulers: Sherre Wooton & Lauren Medical Assistants: Damien ODESSIA Sailors Physician Assistants: Lyle Decamp, PA-C, Edsel Goods, PA-C & Glade Boys, PA-C Surgeons: Reeves Daisy, MD & Penne Sharps, MD   [facility] PREADMIT TESTING VISIT and SURGERY INFORMATION SHEET   Now that surgery has been scheduled you can anticipate several phone calls from [facility] services. A pharmacy technician will call you to verify your current list of medications taken at home.               The Pre-Service Center will call to verify your insurance information and to give you billing estimates and information.             The Preadmit Testing Office will be calling to schedule a visit to obtain information for the anesthesia team and provide instructions on preparation for surgery.  What can you expect for the Preadmit Testing Visit: Appointments may be scheduled in-person or by telephone.  If a telephone visit is scheduled, you may be asked to come into the office to have lab tests or other studies performed.   This visit will not be completed any greater than 14 days prior to your surgery.  If your surgery has been scheduled for a future date, please do not be alarmed if we have not contacted you to schedule an appointment more than a  month prior to the surgery date.    Please be prepared to provide the following information during this appointment:            -Personal medical history                                               -Medication and allergy list            -Any history of problems with anesthesia              -Recent lab  work or diagnostic studies            -Please notify us  of any needs we should be aware of to provide the best care possible           -You will be provided with instructions on how to prepare for your surgery.    On The Day of Surgery:  You must have a driver to take you home after surgery, you will be asked not to drive for 24 hours following surgery.  Taxi, Gisele and non-medical transport will not be acceptable means of transportation unless you have a responsible individual who will be traveling with you.  Visitors in the surgical area:   2 people will be able to visit you in your room once your preparation for surgery has been completed. During surgery, your visitors will be asked to wait in the Surgery Waiting Area.  It is not a requirement for them to stay, if they prefer to leave and come back.  Your visitor(s) will be given an update once the surgery has been completed.  No visitors are allowed in the initial recovery room to respect patient privacy and safety.  Once you are more awake and transfer to the secondary recovery area, or are transferred to an inpatient room, visitors will again be able to see you.  To respect and protect your privacy: We will ask on the day of surgery who your driver will be and what the contact number for that individual will be. We will ask if it is okay to share information with this individual, or if there is an alternative individual that we, or the surgeon, should contact to provide updates and information. If family or friends come to the surgical information desk requesting information about you, who you have not listed with us , no information will be given.   It may be helpful to designate someone as the main contact who will be responsible for updating your other friends and family.    PREADMIT TESTING OFFICE: [phone] SAME DAY SURGERY: [phone] We look forward to caring for you before and throughout the process of your surgery.

## 2024-04-14 ENCOUNTER — Telehealth: Payer: Self-pay

## 2024-04-14 NOTE — Telephone Encounter (Signed)
 Received email from [facility] stating P2P scheduled for 07/03 @ 09:30 AM [place]

## 2024-04-14 NOTE — Telephone Encounter (Signed)
 Received a call from [facility] that the medical direction is denying the request for spinal cord stimulator stating that he must have at least 50% relief and a trial of at least 5 days.   Upon review, Dr Charolotte note states he reported 50% relief. Dr Theressa note confirms greater than 50% improvement in pain and 100% improvement in tingling.   His trial was 03/30/24-04/02/24, but Dr Charolotte note states, He was having irritation with the adhesive which is why we are removing the trial leads earlier than scheduled.   A peer to peer has been requested. They will call Dr Theressa cell phone. They will call Dr Claudene at one of the following dates/times listed below.

## 2024-04-16 ENCOUNTER — Encounter: Payer: Self-pay | Admitting: Neurosurgery

## 2024-04-16 ENCOUNTER — Encounter
Admission: RE | Admit: 2024-04-16 | Discharge: 2024-04-16 | Disposition: A | Discharge status: Home / Self Care | Source: Ambulatory Visit | Attending: Neurosurgery | Admitting: Neurosurgery

## 2024-04-16 ENCOUNTER — Other Ambulatory Visit: Payer: Self-pay

## 2024-04-16 VITALS — BP 127/77 | HR 67 | Resp 16 | Ht 75.5 in | Wt 282.2 lb

## 2024-04-16 DIAGNOSIS — Z01818 Encounter for other preprocedural examination: Secondary | ICD-10-CM | POA: Diagnosis present

## 2024-04-16 DIAGNOSIS — Z01812 Encounter for preprocedural laboratory examination: Secondary | ICD-10-CM

## 2024-04-16 DIAGNOSIS — R030 Elevated blood-pressure reading, without diagnosis of hypertension: Secondary | ICD-10-CM | POA: Insufficient documentation

## 2024-04-16 LAB — CBC
HCT: 43.6 % (ref 39.0–52.0)
Hemoglobin: 14.5 g/dL (ref 13.0–17.0)
MCH: 29.4 pg (ref 26.0–34.0)
MCHC: 33.3 g/dL (ref 30.0–36.0)
MCV: 88.4 fL (ref 80.0–100.0)
Platelets: 301 10*3/uL (ref 150–400)
RBC: 4.93 MIL/uL (ref 4.22–5.81)
RDW: 11.9 % (ref 11.5–15.5)
WBC: 8.4 10*3/uL (ref 4.0–10.5)
nRBC: 0 % (ref 0.0–0.2)

## 2024-04-16 LAB — TYPE AND SCREEN
ABO/RH(D): B POS
Antibody Screen: NEGATIVE

## 2024-04-16 LAB — BASIC METABOLIC PANEL WITH GFR
Anion gap: 10 (ref 5–15)
BUN: 9 mg/dL (ref 6–20)
CO2: 25 mmol/L (ref 22–32)
Calcium: 8.9 mg/dL (ref 8.9–10.3)
Chloride: 105 mmol/L (ref 98–111)
Creatinine, Ser: 0.75 mg/dL (ref 0.61–1.24)
GFR, Estimated: >60 mL/min (ref >60)
Glucose, Bld: 93 mg/dL (ref 70–99)
Potassium: 3.9 mmol/L (ref 3.5–5.1)
Sodium: 140 mmol/L (ref 135–145)

## 2024-04-16 LAB — SURGICAL PCR SCREEN
MRSA, PCR: NEGATIVE
Staphylococcus aureus: NEGATIVE

## 2024-04-16 NOTE — Patient Instructions (Addendum)
 Your procedure is scheduled on: 04/30/24 - Thursday Report to the Registration Desk on the 1st floor of the Medical Mall. To find out your arrival time, please call [phone] between 1PM - 3PM on: 04/29/24 - Wednesday If your arrival time is 6:00 am, do not arrive before that time as the Medical Mall entrance doors do not open until 6:00 am.  REMEMBER: Instructions that are not followed completely may result in serious medical risk, up to and including death; or upon the discretion of your surgeon and anesthesiologist your surgery may need to be rescheduled.  Do not eat food after midnight the night before surgery.  No gum chewing or hard candies.  You may however, drink CLEAR liquids up to 2 hours before you are scheduled to arrive for your surgery. Do not drink anything within 2 hours of your scheduled arrival time.  Clear liquids include: - water  - apple juice without pulp - gatorade (not RED colors) - black coffee or tea (Do NOT add milk or creamers to the coffee or tea) Do NOT drink anything that is not on this list.   You may continue these medications if needed, Anti-inflammatories (NSAIDS) such as Advil , Aleve, Ibuprofen , Motrin , Naproxen, Naprosyn and Aspirin based products such as Excedrin, Goody's Powder, BC Powder.  Stop beginning 04/23/24, ANY OVER THE COUNTER supplements until after surgery : Multivitamin  You may take Tylenol  if needed for pain up until the day of surgery.  ON THE DAY OF SURGERY ONLY TAKE THESE MEDICATIONS WITH SIPS OF WATER:  busPIRone (BUSPAR)  valACYclovir  (VALTREX )    No Alcohol for 24 hours before or after surgery.  No Smoking including e-cigarettes for 24 hours before surgery.  No chewable tobacco products for at least 6 hours before surgery.  No nicotine patches on the day of surgery.  Do not use any recreational drugs for at least a week (preferably 2 weeks) before your surgery.  Please be advised that the combination of cocaine  and anesthesia may have negative outcomes, up to and including death. If you test positive for cocaine, your surgery will be cancelled.  On the morning of surgery brush your teeth with toothpaste and water, you may rinse your mouth with mouthwash if you wish. Do not swallow any toothpaste or mouthwash.  Use CHG Soap or wipes as directed on instruction sheet.  Do not wear jewelry, make-up, hairpins, clips or nail polish.  For welded (permanent) jewelry: bracelets, anklets, waist bands, etc.  Please have this removed prior to surgery.  If it is not removed, there is a chance that hospital personnel will need to cut it off on the day of surgery.  Do not wear lotions, powders, or perfumes.   Contact lenses, hearing aids and dentures may not be worn into surgery.  Do not bring valuables to the hospital. [facility] is not responsible for any missing/lost belongings or valuables.   Notify your doctor if there is any change in your medical condition (cold, fever, infection).  Wear comfortable clothing (specific to your surgery type) to the hospital.  After surgery, you can help prevent lung complications by doing breathing exercises.  Take deep breaths and cough every 1-2 hours. Your doctor may order a device called an Incentive Spirometer to help you take deep breaths.  When coughing or sneezing, hold a pillow firmly against your incision with both hands. This is called "splinting." Doing this helps protect your incision. It also decreases belly discomfort.  If you are  being admitted to the hospital overnight, leave your suitcase in the car. After surgery it may be brought to your room.  In case of increased patient census, it may be necessary for you, the patient, to continue your postoperative care in the Same Day Surgery department.  If you are being discharged the day of surgery, you will not be allowed to drive home. You will need a responsible individual to drive you home and stay  with you for 24 hours after surgery.   If you are taking public transportation, you will need to have a responsible individual with you.  Please call the Pre-admissions Testing Dept. at [phone] if you have any questions about these instructions.  Surgery Visitation Policy:  Patients having surgery or a procedure may have two visitors.  Children under the age of 72 must have an adult with them who is not the patient.  Inpatient Visitation:    Visiting hours are 7 a.m. to 8 p.m. Up to four visitors are allowed at one time in a patient room. The visitors may rotate out with other people during the day.  One visitor age 9 or older may stay with the patient overnight and must be in the room by 8 p.m.   Merchandiser, retail to address health-related social needs:  [email or web address]    Pre-operative 5 CHG Bath Instructions   You can play a key role in reducing the risk of infection after surgery. Your skin needs to be as free of germs as possible. You can reduce the number of germs on your skin by washing with CHG (chlorhexidine gluconate) soap before surgery. CHG is an antiseptic soap that kills germs and continues to kill germs even after washing.   DO NOT use if you have an allergy to chlorhexidine/CHG or antibacterial soaps. If your skin becomes reddened or irritated, stop using the CHG and notify one of our RNs at [phone].   Please shower with the CHG soap starting 4 days before surgery using the following schedule: 07/13 - 07/17.    Please keep in mind the following:  DO NOT shave, including legs and underarms, starting the day of your first shower.   You may shave your face at any point before/day of surgery.  Place clean sheets on your bed the day you start using CHG soap. Use a clean washcloth (not used since being washed) for each shower. DO NOT sleep with pets once you start using the CHG.   CHG Shower Instructions:  If you choose to  wash your hair and private area, wash first with your normal shampoo/soap.  After you use shampoo/soap, rinse your hair and body thoroughly to remove shampoo/soap residue.  Turn the water OFF and apply about 3 tablespoons (45 ml) of CHG soap to a CLEAN washcloth.  Apply CHG soap ONLY FROM YOUR NECK DOWN TO YOUR TOES (washing for 3-5 minutes)  DO NOT use CHG soap on face, private areas, open wounds, or sores.  Pay special attention to the area where your surgery is being performed.  If you are having back surgery, having someone wash your back for you may be helpful. Wait 2 minutes after CHG soap is applied, then you may rinse off the CHG soap.  Pat dry with a clean towel  Put on clean clothes/pajamas   If you choose to wear lotion, please use ONLY the CHG-compatible lotions on the back of this paper.     Additional instructions for the day of surgery:  DO NOT APPLY any lotions, deodorants, cologne, or perfumes.   Put on clean/comfortable clothes.  Brush your teeth.  Ask your nurse before applying any prescription medications to the skin.      CHG Compatible Lotions   Aveeno Moisturizing lotion  Cetaphil Moisturizing Cream  Cetaphil Moisturizing Lotion  Clairol Herbal Essence Moisturizing Lotion, Dry Skin  Clairol Herbal Essence Moisturizing Lotion, Extra Dry Skin  Clairol Herbal Essence Moisturizing Lotion, Normal Skin  Curel Age Defying Therapeutic Moisturizing Lotion with Alpha Hydroxy  Curel Extreme Care Body Lotion  Curel Soothing Hands Moisturizing Hand Lotion  Curel Therapeutic Moisturizing Cream, Fragrance-Free  Curel Therapeutic Moisturizing Lotion, Fragrance-Free  Curel Therapeutic Moisturizing Lotion, Original Formula  Eucerin Daily Replenishing Lotion  Eucerin Dry Skin Therapy Plus Alpha Hydroxy Crme  Eucerin Dry Skin Therapy Plus Alpha Hydroxy Lotion  Eucerin Original Crme  Eucerin Original Lotion  Eucerin Plus Crme Eucerin Plus Lotion  Eucerin TriLipid  Replenishing Lotion  Keri Anti-Bacterial Hand Lotion  Keri Deep Conditioning Original Lotion Dry Skin Formula Softly Scented  Keri Deep Conditioning Original Lotion, Fragrance Free Sensitive Skin Formula  Keri Lotion Fast Absorbing Fragrance Free Sensitive Skin Formula  Keri Lotion Fast Absorbing Softly Scented Dry Skin Formula  Keri Original Lotion  Keri Skin Renewal Lotion Keri Silky Smooth Lotion  Keri Silky Smooth Sensitive Skin Lotion  Nivea Body Creamy Conditioning Oil  Nivea Body Extra Enriched Teacher, adult education Moisturizing Lotion Nivea Crme  Nivea Skin Firming Lotion  NutraDerm 30 Skin Lotion  NutraDerm Skin Lotion  NutraDerm Therapeutic Skin Cream  NutraDerm Therapeutic Skin Lotion  ProShield Protective Hand Cream  Provon moisturizing lotion

## 2024-04-16 NOTE — Telephone Encounter (Signed)
 Peer to peer denied. Reason: they cannot override the plan requirement for a 5 day trial. They recommended an appeal. Expedited appeal request has been faxed to [facility]

## 2024-04-21 NOTE — Telephone Encounter (Signed)
 Called [employer] and confirmed appeal has been received

## 2024-04-23 NOTE — Telephone Encounter (Signed)
 Called [employer] to check on status - appeal is still in process. Per [facility], request is not urgent and has been downgraded to standard timeframe.

## 2024-04-23 NOTE — Telephone Encounter (Signed)
 Per further discussion with [facility]), if the [place] approves his procedure, he is good to go with only the [place] and the [facility] shouldn't matter. The [facility] policy has been removed from his account. He is good to proceed on 04/30/24 regardless of the determination with [employer]. I spoke with the patient about this.

## 2024-04-30 ENCOUNTER — Ambulatory Visit: Admitting: Registered Nurse

## 2024-04-30 ENCOUNTER — Other Ambulatory Visit: Payer: Self-pay

## 2024-04-30 ENCOUNTER — Ambulatory Visit
Admission: RE | Admit: 2024-04-30 | Discharge: 2024-04-30 | Disposition: A | Discharge status: Home / Self Care | Source: Ambulatory Visit | Attending: Neurosurgery | Admitting: Neurosurgery

## 2024-04-30 ENCOUNTER — Encounter: Payer: Self-pay | Admitting: Neurosurgery

## 2024-04-30 ENCOUNTER — Encounter
Admission: RE | Disposition: A | Discharge status: Home / Self Care | Payer: Self-pay | Source: Ambulatory Visit | Attending: Neurosurgery

## 2024-04-30 ENCOUNTER — Ambulatory Visit

## 2024-04-30 ENCOUNTER — Ambulatory Visit: Payer: Self-pay | Admitting: Urgent Care

## 2024-04-30 DIAGNOSIS — M5416 Radiculopathy, lumbar region: Secondary | ICD-10-CM | POA: Insufficient documentation

## 2024-04-30 DIAGNOSIS — Z01818 Encounter for other preprocedural examination: Secondary | ICD-10-CM

## 2024-04-30 DIAGNOSIS — F17290 Nicotine dependence, other tobacco product, uncomplicated: Secondary | ICD-10-CM | POA: Diagnosis not present

## 2024-04-30 DIAGNOSIS — M961 Postlaminectomy syndrome, not elsewhere classified: Secondary | ICD-10-CM | POA: Diagnosis present

## 2024-04-30 DIAGNOSIS — G4730 Sleep apnea, unspecified: Secondary | ICD-10-CM | POA: Insufficient documentation

## 2024-04-30 DIAGNOSIS — G8929 Other chronic pain: Secondary | ICD-10-CM | POA: Diagnosis not present

## 2024-04-30 DIAGNOSIS — K760 Fatty (change of) liver, not elsewhere classified: Secondary | ICD-10-CM | POA: Diagnosis not present

## 2024-04-30 DIAGNOSIS — M5441 Lumbago with sciatica, right side: Secondary | ICD-10-CM | POA: Diagnosis present

## 2024-04-30 HISTORY — PX: THORACIC LAMINECTOMY FOR SPINAL CORD STIMULATOR: SHX6887

## 2024-04-30 LAB — ABO/RH: ABO/RH(D): B POS

## 2024-04-30 SURGERY — THORACIC LAMINECTOMY FOR SPINAL CORD STIMULATOR
Anesthesia: General

## 2024-04-30 MED ORDER — BUPIVACAINE LIPOSOME 1.3 % IJ SUSP
INTRAMUSCULAR | Status: AC
  Filled 2024-04-30: qty 30

## 2024-04-30 MED ORDER — LACTATED RINGERS IV SOLN: INTRAVENOUS | Status: DC

## 2024-04-30 MED ORDER — FENTANYL CITRATE (PF) 100 MCG/2ML IJ SOLN
INTRAMUSCULAR | Status: DC | PRN
  Administered 2024-04-30 (×2): 50 ug via INTRAVENOUS

## 2024-04-30 MED ORDER — ACETAMINOPHEN 10 MG/ML IV SOLN
INTRAVENOUS | Status: DC | PRN
  Administered 2024-04-30: 1000 mg via INTRAVENOUS

## 2024-04-30 MED ORDER — KETOROLAC TROMETHAMINE 30 MG/ML IJ SOLN
INTRAMUSCULAR | Status: DC | PRN
  Administered 2024-04-30: 30 mg via INTRAVENOUS

## 2024-04-30 MED ORDER — SODIUM CHLORIDE (PF) 0.9 % IJ SOLN
INTRAMUSCULAR | Status: AC
  Filled 2024-04-30: qty 20

## 2024-04-30 MED ORDER — OXYCODONE HCL 5 MG PO TABS
ORAL_TABLET | ORAL | Status: AC
Start: 2024-04-30 — End: 2024-04-30
  Filled 2024-04-30: qty 1

## 2024-04-30 MED ORDER — MIDAZOLAM HCL 2 MG/2ML IJ SOLN
INTRAMUSCULAR | Status: AC
  Filled 2024-04-30: qty 2

## 2024-04-30 MED ORDER — SENNA 8.6 MG PO TABS: 1.0000 | ORAL_TABLET | Freq: Every day | ORAL | 0 refills | Status: AC

## 2024-04-30 MED ORDER — DROPERIDOL 2.5 MG/ML IJ SOLN: 0.6250 mg | Freq: Once | INTRAMUSCULAR | Status: DC | PRN

## 2024-04-30 MED ORDER — DEXAMETHASONE SODIUM PHOSPHATE 10 MG/ML IJ SOLN
INTRAMUSCULAR | Status: DC | PRN
  Administered 2024-04-30: 10 mg via INTRAVENOUS

## 2024-04-30 MED ORDER — IRRISEPT - 450ML BOTTLE WITH 0.05% CHG IN STERILE WATER, USP 99.95% OPTIME
TOPICAL | Status: DC | PRN
  Administered 2024-04-30: 450 mL

## 2024-04-30 MED ORDER — VANCOMYCIN HCL IN DEXTROSE 1-5 GM/200ML-% IV SOLN
1000.0000 mg | Freq: Once | INTRAVENOUS | Status: AC
  Administered 2024-04-30: 1000 mg via INTRAVENOUS

## 2024-04-30 MED ORDER — FENTANYL CITRATE (PF) 100 MCG/2ML IJ SOLN
INTRAMUSCULAR | Status: AC
  Filled 2024-04-30: qty 2

## 2024-04-30 MED ORDER — SURGIFLO WITH THROMBIN (HEMOSTATIC MATRIX KIT) OPTIME
TOPICAL | Status: DC | PRN
Start: 2024-04-30 — End: 2024-04-30
  Administered 2024-04-30: 1 via TOPICAL

## 2024-04-30 MED ORDER — DOCUSATE SODIUM 100 MG PO CAPS: 100.0000 mg | ORAL_CAPSULE | Freq: Two times a day (BID) | ORAL | 0 refills | Status: AC

## 2024-04-30 MED ORDER — CEFAZOLIN SODIUM-DEXTROSE 3-4 GM/150ML-% IV SOLN
3.0000 g | INTRAVENOUS | Status: AC
  Administered 2024-04-30: 3 g via INTRAVENOUS
  Filled 2024-04-30: qty 150

## 2024-04-30 MED ORDER — 0.9 % SODIUM CHLORIDE (POUR BTL) OPTIME
TOPICAL | Status: DC | PRN
  Administered 2024-04-30: 422 mL

## 2024-04-30 MED ORDER — LIDOCAINE HCL (CARDIAC) PF 100 MG/5ML IV SOSY
PREFILLED_SYRINGE | INTRAVENOUS | Status: DC | PRN
  Administered 2024-04-30: 100 mg via INTRAVENOUS

## 2024-04-30 MED ORDER — PROPOFOL 1000 MG/100ML IV EMUL
INTRAVENOUS | Status: AC
  Filled 2024-04-30: qty 100

## 2024-04-30 MED ORDER — OXYCODONE HCL 5 MG PO TABS: 5.0000 mg | ORAL_TABLET | ORAL | 0 refills | Status: AC | PRN

## 2024-04-30 MED ORDER — SODIUM CHLORIDE (PF) 0.9 % IJ SOLN
INTRAMUSCULAR | Status: AC
  Filled 2024-04-30: qty 30

## 2024-04-30 MED ORDER — PROPOFOL 10 MG/ML IV BOLUS
INTRAVENOUS | Status: DC | PRN
  Administered 2024-04-30: 125 ug/kg/min via INTRAVENOUS
  Administered 2024-04-30: 300 mg via INTRAVENOUS

## 2024-04-30 MED ORDER — ACETAMINOPHEN 10 MG/ML IV SOLN
INTRAVENOUS | Status: AC
  Filled 2024-04-30: qty 100

## 2024-04-30 MED ORDER — REMIFENTANIL HCL 1 MG IV SOLR
INTRAVENOUS | Status: DC | PRN
  Administered 2024-04-30: .1 ug/kg/min via INTRAVENOUS

## 2024-04-30 MED ORDER — CEFAZOLIN IN SODIUM CHLORIDE 2-0.9 GM/100ML-% IV SOLN
3.0000 g | Freq: Once | INTRAVENOUS | Status: DC
  Filled 2024-04-30: qty 200

## 2024-04-30 MED ORDER — ORAL CARE MOUTH RINSE: 15.0000 mL | Freq: Once | OROMUCOSAL | Status: AC

## 2024-04-30 MED ORDER — LACTATED RINGERS IV SOLN: INTRAVENOUS | Status: DC | PRN

## 2024-04-30 MED ORDER — ONDANSETRON HCL 4 MG/2ML IJ SOLN
INTRAMUSCULAR | Status: DC | PRN
  Administered 2024-04-30: 4 mg via INTRAVENOUS

## 2024-04-30 MED ORDER — GLYCOPYRROLATE 0.2 MG/ML IJ SOLN
INTRAMUSCULAR | Status: DC | PRN
  Administered 2024-04-30: .2 mg via INTRAVENOUS

## 2024-04-30 MED ORDER — CEFAZOLIN SODIUM-DEXTROSE 2-4 GM/100ML-% IV SOLN: 2.0000 g | Freq: Once | INTRAVENOUS | Status: DC

## 2024-04-30 MED ORDER — REMIFENTANIL HCL 1 MG IV SOLR
INTRAVENOUS | Status: AC
Start: 2024-04-30 — End: 2024-04-30
  Filled 2024-04-30: qty 1000

## 2024-04-30 MED ORDER — FENTANYL CITRATE (PF) 100 MCG/2ML IJ SOLN
25.0000 ug | INTRAMUSCULAR | Status: DC | PRN
  Administered 2024-04-30 (×4): 25 ug via INTRAVENOUS

## 2024-04-30 MED ORDER — CHLORHEXIDINE GLUCONATE 0.12 % MT SOLN
OROMUCOSAL | Status: AC
  Filled 2024-04-30: qty 15

## 2024-04-30 MED ORDER — BUPIVACAINE-EPINEPHRINE (PF) 0.5% -1:200000 IJ SOLN
INTRAMUSCULAR | Status: AC
  Filled 2024-04-30: qty 30

## 2024-04-30 MED ORDER — SODIUM CHLORIDE (PF) 0.9 % IJ SOLN
INTRAMUSCULAR | Status: DC | PRN
  Administered 2024-04-30: 30 mL

## 2024-04-30 MED ORDER — SUCCINYLCHOLINE CHLORIDE 200 MG/10ML IV SOSY
PREFILLED_SYRINGE | INTRAVENOUS | Status: DC | PRN
  Administered 2024-04-30: 140 mg via INTRAVENOUS

## 2024-04-30 MED ORDER — SUCCINYLCHOLINE CHLORIDE 200 MG/10ML IV SOSY
PREFILLED_SYRINGE | INTRAVENOUS | Status: AC
  Filled 2024-04-30: qty 10

## 2024-04-30 MED ORDER — OXYCODONE HCL 5 MG PO TABS
5.0000 mg | ORAL_TABLET | Freq: Once | ORAL | Status: AC
  Administered 2024-04-30: 5 mg via ORAL

## 2024-04-30 MED ORDER — DEXAMETHASONE SODIUM PHOSPHATE 10 MG/ML IJ SOLN
INTRAMUSCULAR | Status: AC
  Filled 2024-04-30: qty 1

## 2024-04-30 MED ORDER — VANCOMYCIN HCL IN DEXTROSE 1-5 GM/200ML-% IV SOLN
INTRAVENOUS | Status: AC
  Filled 2024-04-30: qty 200

## 2024-04-30 MED ORDER — MIDAZOLAM HCL 2 MG/2ML IJ SOLN
INTRAMUSCULAR | Status: DC | PRN
  Administered 2024-04-30: 2 mg via INTRAVENOUS

## 2024-04-30 MED ORDER — METHYLPREDNISOLONE ACETATE 40 MG/ML IJ SUSP
INTRAMUSCULAR | Status: AC
  Filled 2024-04-30: qty 1

## 2024-04-30 MED ORDER — BUPIVACAINE HCL (PF) 0.5 % IJ SOLN
INTRAMUSCULAR | Status: AC
  Filled 2024-04-30: qty 90

## 2024-04-30 MED ORDER — ACETAMINOPHEN 500 MG PO TABS: 1000.0000 mg | ORAL_TABLET | Freq: Three times a day (TID) | ORAL | 0 refills | Status: AC | PRN

## 2024-04-30 MED ORDER — CHLORHEXIDINE GLUCONATE 0.12 % MT SOLN
15.0000 mL | Freq: Once | OROMUCOSAL | Status: AC
  Administered 2024-04-30: 15 mL via OROMUCOSAL

## 2024-04-30 MED ORDER — BUPIVACAINE-EPINEPHRINE (PF) 0.5% -1:200000 IJ SOLN
INTRAMUSCULAR | Status: DC | PRN
  Administered 2024-04-30: 10 mL

## 2024-04-30 MED ORDER — KETOROLAC TROMETHAMINE 30 MG/ML IJ SOLN
INTRAMUSCULAR | Status: AC
  Filled 2024-04-30: qty 1

## 2024-04-30 MED ORDER — PHENYLEPHRINE HCL-NACL 20-0.9 MG/250ML-% IV SOLN
INTRAVENOUS | Status: DC | PRN
  Administered 2024-04-30: 20 ug/min via INTRAVENOUS

## 2024-04-30 MED ORDER — PROPOFOL 10 MG/ML IV BOLUS
INTRAVENOUS | Status: AC
  Filled 2024-04-30: qty 20

## 2024-04-30 MED ORDER — LIDOCAINE HCL (PF) 2 % IJ SOLN
INTRAMUSCULAR | Status: AC
  Filled 2024-04-30: qty 5

## 2024-04-30 MED ORDER — ONDANSETRON HCL 4 MG/2ML IJ SOLN
INTRAMUSCULAR | Status: AC
Start: 2024-04-30 — End: 2024-04-30
  Filled 2024-04-30: qty 2

## 2024-04-30 SURGICAL SUPPLY — 35 items
BELT PT INTERSTIM MICRO SYSTEM (MISCELLANEOUS) IMPLANT
BRUSH SCRUB EZ 4% CHG (MISCELLANEOUS) ×1 IMPLANT
BUR NEURO DRILL SOFT 3.0X3.8M (BURR) ×1 IMPLANT
CONTROLLER HANDSET COMM KIT (NEUROSURGERY SUPPLIES) IMPLANT
DERMABOND ADVANCED .7 DNX12 (GAUZE/BANDAGES/DRESSINGS) ×2 IMPLANT
DRAPE C-ARM XRAY 36X54 (DRAPES) ×1 IMPLANT
DRAPE LAPAROTOMY 100X77 ABD (DRAPES) ×1 IMPLANT
DRSG OPSITE POSTOP 4X6 (GAUZE/BANDAGES/DRESSINGS) IMPLANT
DRSG OPSITE POSTOP 4X8 (GAUZE/BANDAGES/DRESSINGS) IMPLANT
ELECTRODE REM PT RTRN 9FT ADLT (ELECTROSURGICAL) ×1 IMPLANT
FEE INTRAOP CADWELL SUPPLY NCS (MISCELLANEOUS) IMPLANT
FEE INTRAOP MONITOR IMPULS NCS (MISCELLANEOUS) IMPLANT
GLOVE BIOGEL PI IND STRL 8 (GLOVE) ×1 IMPLANT
GLOVE SRG 8 PF TXTR STRL LF DI (GLOVE) ×1 IMPLANT
GLOVE SURG SYN 7.5 PF PI (GLOVE) ×1 IMPLANT
GOWN SRG XL LVL 3 NONREINFORCE (GOWNS) ×1 IMPLANT
KIT SPINAL PRONEVIEW (KITS) ×1 IMPLANT
LAVAGE JET IRRISEPT WOUND (IRRIGATION / IRRIGATOR) ×1 IMPLANT
MANIFOLD NEPTUNE II (INSTRUMENTS) ×1 IMPLANT
NDL SAFETY ECLIPSE 18X1.5 (NEEDLE) ×1 IMPLANT
NEUROSTIM INCEPTIV (Neuro Prosthesis/Implant) IMPLANT
NS IRRIG 500ML POUR BTL (IV SOLUTION) ×1 IMPLANT
PACK LAMINECTOMY ARMC (PACKS) ×1 IMPLANT
PAD ARMBOARD POSITIONER FOAM (MISCELLANEOUS) ×1 IMPLANT
POUCH TYRX ANTIBAC NEURO MED (Mesh General) IMPLANT
PROGRAMMER AND COMM CASE (NEUROSURGERY SUPPLIES) IMPLANT
RECHARGER SYSTEM (NEUROSURGERY SUPPLIES) IMPLANT
STIMULATOR CORD SURESCAN MRI (Stimulator) IMPLANT
SURGIFLO W/THROMBIN 8M KIT (HEMOSTASIS) ×1 IMPLANT
SUT SILK 2 0SH CR/8 30 (SUTURE) ×1 IMPLANT
SUT STRATA 3-0 15 PS-2 (SUTURE) IMPLANT
SUT VIC AB 0 CT1 18XCR BRD 8 (SUTURE) ×2 IMPLANT
SUT VIC AB 2-0 CT1 18 (SUTURE) ×2 IMPLANT
SYR 30ML LL (SYRINGE) ×2 IMPLANT
TRAP FLUID SMOKE EVACUATOR (MISCELLANEOUS) ×1 IMPLANT

## 2024-04-30 NOTE — Interval H&P Note (Signed)
 History and Physical Interval Note:  04/30/2024 7:02 AM  Gary Irwin  has presented today for surgery, with the diagnosis of M96.1 Lumbar post-laminectomy syndrome M54.16 Lumbar radicular pain M54.41, G89.29.  The various methods of treatment have been discussed with the patient and family. After consideration of risks, benefits and other options for treatment, the patient has consented to  Procedure(s) with comments: THORACIC LAMINECTOMY FOR SPINAL CORD STIMULATOR (N/A) - THORACIC LAMINECTOMY FOR SPINAL CORD STIMULATOR, INTERAL PULSE GENERATOR (IPG) INSERTION as a surgical intervention.  The patient's history has been reviewed, patient examined, no change in status, stable for surgery.  I have reviewed the patient's chart and labs.  Questions were answered to the patient's satisfaction.    Heart and lungs clear     Gary Irwin

## 2024-04-30 NOTE — Transfer of Care (Signed)
 Immediate Anesthesia Transfer of Care Note  Patient: Gary Irwin  Procedure(s) Performed: THORACIC LAMINECTOMY FOR SPINAL CORD STIMULATOR  Patient Location: PACU  Anesthesia Type:General  Level of Consciousness: drowsy and patient cooperative  Airway & Oxygen Therapy: Patient Spontanous Breathing and Patient connected to face mask oxygen  Post-op Assessment: Report given to RN and Post -op Vital signs reviewed and stable  Post vital signs: Reviewed and stable  Last Vitals:  Vitals Value Taken Time  BP 127/79 04/30/24 09:07  Temp    Pulse 56 04/30/24 09:07  Resp 13 04/30/24 09:07  SpO2 100 % 04/30/24 09:07  Vitals shown include unfiled device data.  Last Pain:  Vitals:   04/30/24 0620  TempSrc: Temporal  PainSc: 7          Complications: No notable events documented.

## 2024-04-30 NOTE — Op Note (Signed)
 Indications: the patient is a 33 yo male who was diagnosed with post laminectomy syndrome, chronic pain, . The patient had a successful trial for spinal cord stimulation, so was consented for placement of a permanent device   Findings: successful placement of a thoracic spinal cord stimulator.    Preoperative Diagnosis: M96.1 Lumbar post-laminectomy syndrome M54.16 Lumbar radicular pain M54.41, G89.29  Postoperative Diagnosis: M96.1 Lumbar post-laminectomy syndrome M54.16 Lumbar radicular pain M54.41, G89.29     EBL: minimal ml IVF: see anesthesia record Drains: none Disposition: Extubated and Stable to PACU Complications: none   No foley catheter was placed.     Preoperative Note:    Risks of surgery discussed in clinic.   Operative Note:      The patient was then brought from the preoperative center with intravenous access established.  The patient underwent general anesthesia and endotracheal tube intubation, then was rotated on the [facility] table where all pressure points were appropriately padded.  An incision was marked with fluoroscopy at T9/10, and on the right flank. The skin was then thoroughly cleansed.  Perioperative antibiotic prophylaxis was administered.  Sterile prep and drapes were then applied and a timeout was then observed.     Once this was complete an incision was opened with the use of a #10 blade knife in the midline at the thoracic incision.  The paraspinus muscled were subperiosteally dissected until the laminae of T9 and T 10 were visualized. Fluoroscopy was used to confirm the level. A self-retaining retractor was placed.   The rongeur was used to remove the spinous process of T9.  The drill was used to thin the bone until the ligamentum flavum was visualized.  The ligamentum was then removed and the dura visualized. This was widened until placement of the paddle lead was possible.     The lead was then advanced to the T 7/9 disc space at the top of the  lead.  The lead was secured with a 2-0 silk suture.     The incision on the flank was then opened and a pocket formed until it was large enough for the pulse generator.  The tunnel er was used to connect between the pocket and the incision.  The lead was inserted into the tunneler and tunneled to the buttock.  The leads were attached to the IPG and impedances checked.  The leads were then tightened.  The IPG was then inserted into the pouch.   Both sites were irrigated.  The wounds were closed in layers with 0 and 2-0 vicryl.  The skin was approximated with Monocryl. A sterile dressing was applied.   Monitoring was stable throughout, collision studies demonstrated successful bilateral stimulation   Patient was then rotated back to the preoperative bed awakened from anesthesia and taken to recovery. All counts are correct in this case.   I performed the entire procedure without an Designer, television/film set.   Implant Name Type Inv. Item Serial No. Manufacturer Lot No. LRB No. Used Action  STIMULATOR CORD SURESCAN MRI - [id number] Stimulator STIMULATOR CORD SURESCAN MRI  MEDTRONIC NEUROMOD PAIN MGMT [id number] N/A 1 Implanted  NAOMIA FALCON - [id number] H Neuro Prosthesis/Implant NAOMIA FALCON [id number] H MEDTR[employer]IC NEUROMOD PAIN MGMT  N/A 1 Implanted  [address] NEURO MED - [id number] Mesh General POUCH JUDITHE BELTS NEURO MED  MEDTRONIC NEUROMOD PAIN MGMT [id number] N/A 1 Implanted    Penne MICAEL Sharps, MD

## 2024-04-30 NOTE — Anesthesia Procedure Notes (Signed)
 Procedure Name: Intubation Date/Time: 04/30/2024 7:30 AM  Performed by: Lorrene Camelia LABOR, CRNAPre-anesthesia Checklist: Patient identified, Patient being monitored, Emergency Drugs available and Suction available Patient Re-evaluated:Patient Re-evaluated prior to induction Oxygen Delivery Method: Circle system utilized Preoxygenation: Pre-oxygenation with 100% oxygen Induction Type: IV induction Ventilation: Mask ventilation without difficulty Laryngoscope Size: 3 and McGrath Grade View: Grade I Tube type: Oral Tube size: 7.5 mm Number of attempts: 1 Airway Equipment and Method: Stylet Placement Confirmation: ETT inserted through vocal cords under direct vision, positive ETCO2 and breath sounds checked- equal and bilateral Secured at: 25 cm Tube secured with: Tape Dental Injury: Teeth and Oropharynx as per pre-operative assessment  Comments: Soft bite block placed

## 2024-04-30 NOTE — Telephone Encounter (Signed)
 [employer] granted partial approval. The CPT codes 36344 & [id number] are approved. The C codes are not covered/approved/not needed for prior authorization. Auth# [id number] valid for DOS 04/30/24.

## 2024-04-30 NOTE — Discharge Instructions (Signed)
 Your surgeon has performed an operation on your spine to place a spinal cord stimulator. If you do not let your back heal properly from the surgery, you can increase the chance of hardware complications and/or return of your symptoms. The following are instructions to help in your recovery once you have been discharged from the hospital.    Activity    No bending, lifting, or twisting ("BLT"). Avoid lifting objects heavier than 10 pounds (gallon milk jug).  Where possible, avoid household activities that involve lifting, bending, pushing, or pulling such as laundry, vacuuming, grocery shopping, and childcare. Try to arrange for help from friends and family for these activities while your back heals.  Increase physical activity slowly as tolerated.  Taking short walks is encouraged, but avoid strenuous exercise. Do not jog, run, bicycle, lift weights, or participate in any other exercises unless specifically allowed by your doctor. Avoid prolonged sitting, including car rides.  Talk to your doctor before resuming sexual activity.  You should not drive until cleared by your doctor.  Until released by your doctor, you should not return to work or school.  You should rest at home and let your body heal.   You may shower three days after your surgery.  After showering, lightly dab your incision dry. Do not take a tub bath or go swimming for 3 weeks, or until approved by your doctor at your follow-up appointment.  If you smoke, we strongly recommend that you quit.  Smoking has been proven to interfere with normal healing in your back and will dramatically reduce the success rate of your surgery. Please contact [facility] (800-QUIT-NOW) and use the resources at www.[facility].com for assistance in stopping smoking.  Surgical Incision   If you have a dressing on your incision, you may remove it three days after your surgery. Keep your incision area clean and dry.  Your incision was closed with  Dermabond glue. The glue should begin to peel away within about a week.  Diet            You may return to your usual diet. Be sure to stay hydrated.  You have been prescribed narcotic pain medications.  This often will cause constipation along with the anesthesia that you underwent.  Please obtain Colace and senna. This has been sent to your local pharmacy, but at times it is not covered by insurance and you may obtain it over the counter..  You should take a stool softener and laxative for the duration of you taking the narcotic pain medications.  When to Contact Us   Although your surgery and recovery will likely be uneventful, you may have some residual numbness, aches, and pains in your back and/or legs. This is normal and should improve in the next few weeks.  However, should you experience any of the following, contact us  immediately: New numbness or weakness Pain that is progressively getting worse, and is not relieved by your pain medications or rest Bleeding, redness, swelling, pain, or drainage from surgical incision Chills or flu-like symptoms Fever greater than 101.0 F (38.3 C) Problems with bowel or bladder functions Difficulty breathing or shortness of breath Warmth, tenderness, or swelling in your calf  Contact Information How to contact us :  If you have any questions/concerns before or after surgery, you can reach us  at [phone], or you can send a mychart message. We can be reached by phone or mychart 8am-4pm, Monday-Friday.  *Please note: Calls after 4pm are forwarded to a third party answering  service. Mychart messages are not routinely monitored during evenings, weekends, and holidays. Please call our office to contact the answering service for urgent concerns during non-business hours.

## 2024-04-30 NOTE — Anesthesia Preprocedure Evaluation (Signed)
 Anesthesia Evaluation  Patient identified by MRN, date of birth, ID band Patient awake    Reviewed: Allergy & Precautions, H&P , NPO status , Patient's Chart, lab work & pertinent test results, reviewed documented beta blocker date and time   History of Anesthesia Complications Negative for: history of anesthetic complications  Airway Mallampati: I  TM Distance: >3 FB Neck ROM: full    Dental  (+) Caps, Implants, Dental Advidsory Given, Teeth Intact   Pulmonary neg shortness of breath, asthma (as a child) , sleep apnea , neg recent URI, former smoker   Pulmonary exam normal breath sounds clear to auscultation       Cardiovascular Exercise Tolerance: Good negative cardio ROS Normal cardiovascular exam Rhythm:regular Rate:Normal     Neuro/Psych  PSYCHIATRIC DISORDERS  Depression    negative neurological ROS     GI/Hepatic ,GERD  ,,NAFLD   Endo/Other  negative endocrine ROS    Renal/GU negative Renal ROS  negative genitourinary   Musculoskeletal   Abdominal   Peds  Hematology negative hematology ROS (+)   Anesthesia Other Findings Past Medical History: No date: Arthritis No date: Asthma No date: Depression No date: Frequent headaches 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 No date: Sleep apnea   Reproductive/Obstetrics negative OB ROS                              Anesthesia Physical Anesthesia Plan  ASA: 2  Anesthesia Plan: General   Post-op Pain Management:    Induction: Intravenous  PONV Risk Score and Plan: 2 and Ondansetron , Dexamethasone , Midazolam  and Treatment may vary due to age or medical condition  Airway Management Planned: Oral ETT  Additional Equipment:   Intra-op Plan:   Post-operative Plan: Extubation in OR  Informed Consent: I have reviewed the patients History and Physical, chart, labs and discussed the procedure including the risks, benefits and  alternatives for the proposed anesthesia with the patient or authorized representative who has indicated his/her understanding and acceptance.     Dental Advisory Given  Plan Discussed with: Anesthesiologist, CRNA and Surgeon  Anesthesia Plan Comments:          Anesthesia Quick Evaluation

## 2024-05-01 ENCOUNTER — Encounter: Payer: Self-pay | Admitting: Neurosurgery

## 2024-05-03 NOTE — Anesthesia Postprocedure Evaluation (Signed)
 Anesthesia Post Note  Patient: Gary Irwin  Procedure(s) Performed: THORACIC LAMINECTOMY FOR SPINAL CORD STIMULATOR  Patient location during evaluation: PACU Anesthesia Type: General Level of consciousness: awake and alert Pain management: pain level controlled Vital Signs Assessment: post-procedure vital signs reviewed and stable Respiratory status: spontaneous breathing, nonlabored ventilation, respiratory function stable and patient connected to nasal cannula oxygen Cardiovascular status: blood pressure returned to baseline and stable Postop Assessment: no apparent nausea or vomiting Anesthetic complications: no   No notable events documented.   Last Vitals:  Vitals:   04/30/24 1000 04/30/24 1018  BP: (!) 101/58 116/71  Pulse: (!) 42 (!) 44  Resp: 11 18  Temp: (!) 36.1 C (!) 35.9 C  SpO2: 100% 100%    Last Pain:  Vitals:   04/30/24 1018  TempSrc: Temporal  PainSc: 3                  Prentice Murphy

## 2024-05-04 ENCOUNTER — Telehealth: Payer: Self-pay | Admitting: Neurosurgery

## 2024-05-04 MED ORDER — METHOCARBAMOL 750 MG PO TABS: 750.0000 mg | ORAL_TABLET | Freq: Four times a day (QID) | ORAL | 0 refills | Status: AC | PRN

## 2024-05-04 NOTE — Telephone Encounter (Signed)
 I spoke with Mr Kauk and discussed Dr Theressa recommendations. He would like to try the methocarbamol . I did inform him that this could make him drowsy. I encouraged him to call us  back if he is not tolerating the medication or if it is not helping.

## 2024-05-04 NOTE — Telephone Encounter (Signed)
 thoracic laminectomy for spinal cord stimulator placement and internal pulse generator placement ([employer]) on 04/30/24/[employer] notified of appt   Patient is calling to see if the pain that he is having in his ribs is normal following surgery. He rates the pain as being constant at 7-8/10. Please advise.

## 2024-05-04 NOTE — Telephone Encounter (Addendum)
 Per discussion with Dr Claudene, this would be expected based on the calcifications he had. Can send in a 30 day supply of methocarbamol  750mg  for him to take every 6 hours as needed.

## 2024-05-13 ENCOUNTER — Encounter: Payer: Self-pay | Admitting: Physician Assistant

## 2024-05-13 ENCOUNTER — Ambulatory Visit (INDEPENDENT_AMBULATORY_CARE_PROVIDER_SITE_OTHER): Admitting: Physician Assistant

## 2024-05-13 VITALS — BP 124/84 | Temp 98.5°F | Ht 75.0 in | Wt 277.0 lb

## 2024-05-13 DIAGNOSIS — Z09 Encounter for follow-up examination after completed treatment for conditions other than malignant neoplasm: Secondary | ICD-10-CM

## 2024-05-13 DIAGNOSIS — M961 Postlaminectomy syndrome, not elsewhere classified: Secondary | ICD-10-CM

## 2024-05-13 NOTE — Progress Notes (Signed)
   REFERRING PHYSICIAN:  [facility] [address] [place],  [place] [address]  DOS: 04/30/2024, thoracic laminectomy for spinal cord stimulator  HISTORY OF PRESENT ILLNESS: Gary Irwin is approximately 2 weeks status post thoracic laminectomy for spinal cord stimulator. he is doing well.  His pain is well-controlled.  He is eager to have his stimulator optimized.  PHYSICAL EXAMINATION:  General: Patient is well developed, well nourished, calm, collected, and in no apparent distress.   NEUROLOGICAL:  General: In no acute distress.   Awake, alert, oriented to person, place, and time.  Pupils equal round and reactive to light.  Facial tone is symmetric.     Strength:            Side Iliopsoas Quads Hamstring PF DF EHL  R 5 5 5 5 5 5   L 5 5 5 5 5 5    Incisions c/d/i   ROS (Neurologic):  Negative except as noted above  IMAGING: No new imaging prior to his appointment  ASSESSMENT/PLAN:  Gary Irwin is doing well approximately 2 weeks after thoracic spinal cord stimulator.  He is doing well. I have advised the patient to lift up to 10 pounds until 6 weeks after surgery, then increase up to 25 pounds until 12 weeks after surgery.  After 12 weeks post-op, the patient advised to increase activity as tolerated.  Advised to contact the office if any questions or concerns arise.  Lyle Decamp PA-C Department of neurosurgery

## 2024-06-10 ENCOUNTER — Encounter: Payer: Self-pay | Admitting: Neurosurgery

## 2024-06-10 ENCOUNTER — Ambulatory Visit (INDEPENDENT_AMBULATORY_CARE_PROVIDER_SITE_OTHER): Admitting: Neurosurgery

## 2024-06-10 VITALS — BP 138/72 | Temp 98.1°F | Ht 75.0 in | Wt 277.0 lb

## 2024-06-10 DIAGNOSIS — Z09 Encounter for follow-up examination after completed treatment for conditions other than malignant neoplasm: Secondary | ICD-10-CM

## 2024-06-10 DIAGNOSIS — Z9689 Presence of other specified functional implants: Secondary | ICD-10-CM | POA: Insufficient documentation

## 2024-06-10 DIAGNOSIS — M961 Postlaminectomy syndrome, not elsewhere classified: Secondary | ICD-10-CM

## 2024-06-10 NOTE — Progress Notes (Signed)
   REFERRING PHYSICIAN:  [facility] [address] [place],  [place] [address]  DOS: 04/30/2024, thoracic laminectomy for spinal cord stimulator  HISTORY OF PRESENT ILLNESS: Gary Irwin is approximately 6 weeks status post thoracic laminectomy for spinal cord stimulator. he is doing well.  He does have some left-sided radicular pain.  This has been recently programmed to 4 on his stimulator today in clinic.  He does state that his lower extremities are feeling better.  He was able to bowl yesterday and had significant improvement in his pain compared to preoperatively.  PHYSICAL EXAMINATION:  General: Patient is well developed, well nourished, calm, collected, and in no apparent distress.   NEUROLOGICAL:  General: In no acute distress.   Awake, alert, oriented to person, place, and time.  Pupils equal round and reactive to light.  Facial tone is symmetric.     Strength:            Side Iliopsoas Quads Hamstring PF DF EHL  R 5 5 5 5 5 5   L 5 5 5 5 5 5    Incisions c/d/i   ROS (Neurologic):  Negative except as noted above  IMAGING: No new imaging prior to his appointment  ASSESSMENT/PLAN:  Gary Irwin is doing well approximately 6 weeks after thoracic spinal cord stimulator.  Overall he is doing relatively well.  He does have some left-sided thoracic radicular type pain versus intercostal type pain, this is sometimes seen after spinal cord stimulator placement.  Generally will improve.  He is increased his activities of daily living.  Overall doing well.  He like to go back to work.  I like him to continue with 25 pound lifting restriction until seen for his 25-month appointment.  Advised to contact the office if any questions or concerns arise.  Penne MICAEL Sharps, MD Department of neurosurgery

## 2024-06-16 ENCOUNTER — Telehealth: Payer: Self-pay | Admitting: Neurosurgery

## 2024-06-16 NOTE — Telephone Encounter (Signed)
 Work note and FMLA paperwork originally just stated can return to work with 25 lb restrictions but not the actual work date, is it ok to add 06/22/24 date?

## 2024-06-16 NOTE — Telephone Encounter (Signed)
 Patient called to see if his work note and FMLA paperwork could reflect him returning to work on 06/22/2024 with the 25 pound restriction. Please advise.

## 2024-06-17 NOTE — Telephone Encounter (Signed)
Faxed to employer

## 2024-06-22 ENCOUNTER — Telehealth: Payer: Self-pay

## 2024-06-22 NOTE — Telephone Encounter (Signed)
 DOS: 04/30/2024, thoracic laminectomy for spinal cord stimulator   Gary Irwin contacted our office. He states when he saw Dr Claudene on 06/10/24, he discussed pain in the front part of his ribs. He has now also noticed that when he looks down to his feet, the pain in the front wraps around to the back side of his ribs. The pain is only on the left and he describes it as a pulling.  He does not have any future appointments scheduled with our office.

## 2024-06-23 MED ORDER — METHYLPREDNISOLONE 4 MG PO TBPK: ORAL_TABLET | ORAL | 0 refills | Status: AC

## 2024-06-23 NOTE — Telephone Encounter (Signed)
 I explained Dr Theressa response. He would like to try steroids to see if these help. He confirmed he has taken these before. I have sent this to [facility] in [place] per his request.
